# Patient Record
Sex: Female | Born: 1970
Health system: Southern US, Community
[De-identification: ages and names within clinical notes are randomized; demographics above are authoritative.]

## PROBLEM LIST (undated history)

## (undated) DIAGNOSIS — Z8619 Personal history of other infectious and parasitic diseases: Secondary | ICD-10-CM

## (undated) DIAGNOSIS — R87619 Unspecified abnormal cytological findings in specimens from cervix uteri: Secondary | ICD-10-CM

## (undated) DIAGNOSIS — IMO0002 Reserved for concepts with insufficient information to code with codable children: Secondary | ICD-10-CM

## (undated) DIAGNOSIS — L309 Dermatitis, unspecified: Secondary | ICD-10-CM

## (undated) DIAGNOSIS — B977 Papillomavirus as the cause of diseases classified elsewhere: Secondary | ICD-10-CM

## (undated) DIAGNOSIS — K219 Gastro-esophageal reflux disease without esophagitis: Secondary | ICD-10-CM

## (undated) DIAGNOSIS — L219 Seborrheic dermatitis, unspecified: Secondary | ICD-10-CM

## (undated) DIAGNOSIS — R87629 Unspecified abnormal cytological findings in specimens from vagina: Secondary | ICD-10-CM

## (undated) DIAGNOSIS — N943 Premenstrual tension syndrome: Secondary | ICD-10-CM

## (undated) HISTORY — DX: Gastro-esophageal reflux disease without esophagitis: K21.9

## (undated) HISTORY — DX: Personal history of other infectious and parasitic diseases: Z86.19

## (undated) HISTORY — DX: Unspecified abnormal cytological findings in specimens from cervix uteri: R87.619

## (undated) HISTORY — DX: Seborrheic dermatitis, unspecified: L21.9

## (undated) HISTORY — DX: Reserved for concepts with insufficient information to code with codable children: IMO0002

## (undated) HISTORY — DX: Papillomavirus as the cause of diseases classified elsewhere: B97.7

## (undated) HISTORY — PX: DILATION AND CURETTAGE OF UTERUS: SHX78

## (undated) HISTORY — PX: WISDOM TOOTH EXTRACTION: SHX21

## (undated) HISTORY — DX: Unspecified abnormal cytological findings in specimens from vagina: R87.629

## (undated) HISTORY — DX: Dermatitis, unspecified: L30.9

## (undated) HISTORY — DX: Premenstrual tension syndrome: N94.3

## (undated) HISTORY — PX: TUBAL LIGATION: SHX77

---

## 2001-04-03 ENCOUNTER — Other Ambulatory Visit: Admission: RE | Admit: 2001-04-03 | Discharge: 2001-04-03 | Payer: Self-pay | Admitting: Obstetrics and Gynecology

## 2008-11-17 ENCOUNTER — Other Ambulatory Visit: Admission: RE | Admit: 2008-11-17 | Discharge: 2008-11-17 | Payer: Self-pay | Admitting: Obstetrics and Gynecology

## 2010-02-01 ENCOUNTER — Other Ambulatory Visit: Admission: RE | Admit: 2010-02-01 | Discharge: 2010-02-01 | Payer: Self-pay | Admitting: Obstetrics and Gynecology

## 2010-03-17 ENCOUNTER — Encounter: Admission: RE | Admit: 2010-03-17 | Discharge: 2010-03-17 | Payer: Self-pay | Admitting: Obstetrics and Gynecology

## 2011-02-15 ENCOUNTER — Other Ambulatory Visit: Payer: Self-pay | Admitting: Obstetrics and Gynecology

## 2011-02-15 DIAGNOSIS — Z1231 Encounter for screening mammogram for malignant neoplasm of breast: Secondary | ICD-10-CM

## 2011-02-17 ENCOUNTER — Other Ambulatory Visit (HOSPITAL_COMMUNITY)
Admission: RE | Admit: 2011-02-17 | Discharge: 2011-02-17 | Disposition: A | Discharge status: Home / Self Care | Payer: BC Managed Care – PPO | Source: Ambulatory Visit | Attending: Obstetrics and Gynecology | Admitting: Obstetrics and Gynecology

## 2011-02-17 DIAGNOSIS — Z01419 Encounter for gynecological examination (general) (routine) without abnormal findings: Secondary | ICD-10-CM | POA: Insufficient documentation

## 2011-03-21 ENCOUNTER — Ambulatory Visit
Admission: RE | Admit: 2011-03-21 | Discharge: 2011-03-21 | Disposition: A | Discharge status: Home / Self Care | Payer: BC Managed Care – PPO | Source: Ambulatory Visit | Attending: Obstetrics and Gynecology | Admitting: Obstetrics and Gynecology

## 2011-03-21 DIAGNOSIS — Z1231 Encounter for screening mammogram for malignant neoplasm of breast: Secondary | ICD-10-CM

## 2012-02-22 ENCOUNTER — Other Ambulatory Visit: Payer: Self-pay | Admitting: Obstetrics and Gynecology

## 2012-02-22 DIAGNOSIS — Z1231 Encounter for screening mammogram for malignant neoplasm of breast: Secondary | ICD-10-CM

## 2012-03-27 ENCOUNTER — Ambulatory Visit
Admission: RE | Admit: 2012-03-27 | Discharge: 2012-03-27 | Disposition: A | Discharge status: Home / Self Care | Payer: BC Managed Care – PPO | Source: Ambulatory Visit | Attending: Obstetrics and Gynecology | Admitting: Obstetrics and Gynecology

## 2012-03-27 DIAGNOSIS — Z1231 Encounter for screening mammogram for malignant neoplasm of breast: Secondary | ICD-10-CM

## 2012-06-13 ENCOUNTER — Other Ambulatory Visit (HOSPITAL_COMMUNITY)
Admission: RE | Admit: 2012-06-13 | Discharge: 2012-06-13 | Disposition: A | Discharge status: Home / Self Care | Payer: BC Managed Care – PPO | Source: Ambulatory Visit | Attending: Obstetrics and Gynecology | Admitting: Obstetrics and Gynecology

## 2012-06-13 DIAGNOSIS — Z01419 Encounter for gynecological examination (general) (routine) without abnormal findings: Secondary | ICD-10-CM | POA: Insufficient documentation

## 2012-06-13 DIAGNOSIS — Z1151 Encounter for screening for human papillomavirus (HPV): Secondary | ICD-10-CM | POA: Insufficient documentation

## 2013-01-28 ENCOUNTER — Encounter: Payer: Self-pay | Admitting: *Deleted

## 2013-01-28 DIAGNOSIS — L309 Dermatitis, unspecified: Secondary | ICD-10-CM | POA: Insufficient documentation

## 2013-01-28 DIAGNOSIS — N943 Premenstrual tension syndrome: Secondary | ICD-10-CM

## 2013-01-28 DIAGNOSIS — I2510 Atherosclerotic heart disease of native coronary artery without angina pectoris: Secondary | ICD-10-CM

## 2013-01-29 ENCOUNTER — Ambulatory Visit (INDEPENDENT_AMBULATORY_CARE_PROVIDER_SITE_OTHER): Payer: BC Managed Care – PPO | Admitting: Adult Health

## 2013-01-29 ENCOUNTER — Encounter: Payer: Self-pay | Admitting: Adult Health

## 2013-01-29 VITALS — BP 132/92 | Ht 63.0 in | Wt 161.0 lb

## 2013-01-29 DIAGNOSIS — L739 Follicular disorder, unspecified: Secondary | ICD-10-CM

## 2013-01-29 DIAGNOSIS — L738 Other specified follicular disorders: Secondary | ICD-10-CM

## 2013-01-29 DIAGNOSIS — L219 Seborrheic dermatitis, unspecified: Secondary | ICD-10-CM | POA: Insufficient documentation

## 2013-01-29 HISTORY — DX: Seborrheic dermatitis, unspecified: L21.9

## 2013-01-29 NOTE — Patient Instructions (Addendum)
Follow up prn  Sign up for my chart

## 2013-01-29 NOTE — Progress Notes (Signed)
Subjective:     Patient ID: Brenda Silva, female   DOB: [birth year], 42 y.o.   MRN: [id number]  HPI [name] is a 42 year old white female in with resolving rash on right breast, has been on antibiotic from Dr. Jorja Loa, has stopped medications. She was seen by Dr. Jorja Loa for seborrheic dermatitis and red scaly areas that come and go and leave scars, ?cutaneous lupus?.  Review of Systems Rash right breast, no itching and it is resolving now. Dry scaly lesions eyebrows and ears.   Reviewed past medical,surgical, social and family history. Reviewed medications and allergies.  Objective:   Physical Exam Blood pressure 132/92, height 5\' 3"  (1.6 m), weight 161 lb (73.029 kg), last menstrual period 01/25/2013.   Skin warm and dry. Face: dry scaly areas in eye brows and ears. No red lesions noted today. Breast: no dominant masses, retraction or nipple discharge, there are a few areas of what appears to be resolving folliculitis on the right breast. Assessment:      Resolving folliculitis, right breast   Seborrheic dermatitis  Plan:    Use medication prescribed by Dr. Jorja Loa and follow up with him for ? Biopsy of lesions to r/o cutaneous lupus  Return prn

## 2013-02-18 ENCOUNTER — Other Ambulatory Visit: Payer: Self-pay

## 2013-02-18 DIAGNOSIS — Z1231 Encounter for screening mammogram for malignant neoplasm of breast: Secondary | ICD-10-CM

## 2013-03-28 ENCOUNTER — Ambulatory Visit
Admission: RE | Admit: 2013-03-28 | Discharge: 2013-03-28 | Disposition: A | Discharge status: Home / Self Care | Payer: BC Managed Care – PPO | Source: Ambulatory Visit

## 2013-03-28 DIAGNOSIS — Z1231 Encounter for screening mammogram for malignant neoplasm of breast: Secondary | ICD-10-CM

## 2013-06-18 ENCOUNTER — Ambulatory Visit (INDEPENDENT_AMBULATORY_CARE_PROVIDER_SITE_OTHER): Payer: BC Managed Care – PPO | Admitting: Adult Health

## 2013-06-18 ENCOUNTER — Encounter: Payer: Self-pay | Admitting: Adult Health

## 2013-06-18 VITALS — BP 120/60 | HR 74 | Ht 63.0 in | Wt 160.0 lb

## 2013-06-18 DIAGNOSIS — Z1212 Encounter for screening for malignant neoplasm of rectum: Secondary | ICD-10-CM

## 2013-06-18 DIAGNOSIS — K219 Gastro-esophageal reflux disease without esophagitis: Secondary | ICD-10-CM | POA: Insufficient documentation

## 2013-06-18 DIAGNOSIS — Z01419 Encounter for gynecological examination (general) (routine) without abnormal findings: Secondary | ICD-10-CM

## 2013-06-18 LAB — HEMOCCULT GUIAC POC 1CARD (OFFICE): Fecal Occult Blood, POC: NEGATIVE

## 2013-06-18 MED ORDER — DEXLANSOPRAZOLE 60 MG PO CPDR: 60.0000 mg | DELAYED_RELEASE_CAPSULE | Freq: Every day | ORAL | Status: DC

## 2013-06-18 NOTE — Patient Instructions (Addendum)
Physical in 1 year Mammogram yearly Colonoscopy at 48  Think about flu shot

## 2013-06-18 NOTE — Progress Notes (Signed)
Patient ID: Brenda Silva, female   DOB: 12-16-1970, 42 y.o.   MRN: [id number] History of Present Illness:  Brenda Silva is a 42 year old white female married in for a physical. She had a normal pap with negative HPV 06/2012.  Current Medications, Allergies, Past Medical History, Past Surgical History, Family History and Social History were reviewed in [employer] record.     Review of Systems: Patient denies any headaches, blurred vision, shortness of breath, chest pain, abdominal pain, problems with bowel movements, urination, or intercourse. No joint pain or mood swings, she is happy with dexilant feels great.    Physical Exam:BP 120/60  Pulse 74  Ht 5\' 3"  (1.6 m)  Wt 160 lb (72.576 kg)  BMI 28.35 kg/m2  LMP 06/08/2013 General:  Well developed, well nourished, no acute distress Skin:  Warm and dry Neck:  Midline trachea, normal thyroid Lungs; Clear to auscultation bilaterally Breast:  No dominant palpable mass, retraction, or nipple discharge Cardiovascular: Regular rate and rhythm Abdomen:  Soft, non tender, no hepatosplenomegaly Pelvic:  External genitalia is normal in appearance.  The vagina is normal in appearance. The cervix is bulbous.  Uterus is felt to be normal size, shape, and contour.  No adnexal masses or tenderness noted. Rectal: Good sphincter tone, no polyps, or hemorrhoids felt.  Hemoccult negative. Extremities:  No swelling or varicosities noted Psych:  Alert and cooperative seems happy   Impression:  Yearly gyn exam-no pap GERD   Plan: Physical in 1 year  Mammogram yearly Colonoscopy at 48  Get flu shot Refilled dexilant x 1 year

## 2014-02-18 ENCOUNTER — Other Ambulatory Visit: Payer: Self-pay

## 2014-02-18 DIAGNOSIS — Z1231 Encounter for screening mammogram for malignant neoplasm of breast: Secondary | ICD-10-CM

## 2014-03-31 ENCOUNTER — Ambulatory Visit
Admission: RE | Admit: 2014-03-31 | Discharge: 2014-03-31 | Disposition: A | Discharge status: Home / Self Care | Payer: BC Managed Care – PPO | Source: Ambulatory Visit

## 2014-03-31 DIAGNOSIS — Z1231 Encounter for screening mammogram for malignant neoplasm of breast: Secondary | ICD-10-CM

## 2014-06-23 ENCOUNTER — Encounter: Payer: Self-pay | Admitting: Adult Health

## 2014-06-23 ENCOUNTER — Ambulatory Visit (INDEPENDENT_AMBULATORY_CARE_PROVIDER_SITE_OTHER): Payer: BC Managed Care – PPO | Admitting: Adult Health

## 2014-06-23 ENCOUNTER — Other Ambulatory Visit (HOSPITAL_COMMUNITY)
Admission: RE | Admit: 2014-06-23 | Discharge: 2014-06-23 | Disposition: A | Discharge status: Home / Self Care | Payer: BC Managed Care – PPO | Source: Ambulatory Visit | Attending: Adult Health | Admitting: Adult Health

## 2014-06-23 VITALS — BP 112/80 | HR 76 | Ht 63.0 in | Wt 166.0 lb

## 2014-06-23 DIAGNOSIS — K219 Gastro-esophageal reflux disease without esophagitis: Secondary | ICD-10-CM

## 2014-06-23 DIAGNOSIS — Z1151 Encounter for screening for human papillomavirus (HPV): Secondary | ICD-10-CM | POA: Insufficient documentation

## 2014-06-23 DIAGNOSIS — Z1212 Encounter for screening for malignant neoplasm of rectum: Secondary | ICD-10-CM

## 2014-06-23 DIAGNOSIS — Z01419 Encounter for gynecological examination (general) (routine) without abnormal findings: Secondary | ICD-10-CM

## 2014-06-23 LAB — HEMOCCULT GUIAC POC 1CARD (OFFICE): Fecal Occult Blood, POC: NEGATIVE

## 2014-06-23 MED ORDER — DEXLANSOPRAZOLE 60 MG PO CPDR: 60.0000 mg | DELAYED_RELEASE_CAPSULE | Freq: Every day | ORAL | Status: DC

## 2014-06-23 NOTE — Progress Notes (Signed)
Patient ID: Brenda Silva, female   DOB: 1970/10/09, 43 y.o.   MRN: [id number] History of Present Illness: Brenda Silva is a 43 year old white female, married, in for a pap and physical.Periods ate lite and short and having hot flashes at night.   Current Medications, Allergies, Past Medical History, Past Surgical History, Family History and Social History were reviewed in [employer] record.     Review of Systems: Patient denies any headaches, blurred vision, shortness of breath, chest pain, abdominal pain, problems with bowel movements, urination, or intercourse.  No joint pain or mood swings.   Physical Exam:BP 112/80  Pulse 76  Ht 5\' 3"  (1.6 m)  Wt 166 lb (75.297 kg)  BMI 29.41 kg/m2  LMP 06/06/2014 General:  Well developed, well nourished, no acute distress Skin:  Warm and dry Neck:  Midline trachea, normal thyroid Lungs; Clear to auscultation bilaterally Breast:  No dominant palpable mass, retraction, or nipple discharge Cardiovascular: Regular rate and rhythm Abdomen:  Soft, non tender, no hepatosplenomegaly Pelvic:  External genitalia is normal in appearance.  The vagina is normal in appearance. The cervix is bulbous, Pap with HPV preformed.Marland Kitchen  Uterus is felt to be normal size, shape, and contour.  No                adnexal masses or tenderness noted. Rectal: Good sphincter tone, no polyps, or hemorrhoids felt.  Hemoccult negative. Extremities:  No swelling or varicosities noted Psych:  No mood changes, alert and cooperative, seems happy    Impression: Yearly gyn exam GERD    Plan:  Refilled dexilant 60 mg #30 1 daily with 11 refills Return in 1 year for physical  Mammogram yearly Check CBC,TSH,CMP and lipids Advised flu shot

## 2014-06-23 NOTE — Patient Instructions (Addendum)
Perimenopause Perimenopause is the time when your body begins to move into the menopause (no menstrual period for 12 straight months). It is a natural process. Perimenopause can begin 2-8 years before the menopause and usually lasts for 1 year after the menopause. During this time, your ovaries may or may not produce an egg. The ovaries vary in their production of estrogen and progesterone hormones each month. This can cause irregular menstrual periods, difficulty getting pregnant, vaginal bleeding between periods, and uncomfortable symptoms. CAUSES  Irregular production of the ovarian hormones, estrogen and progesterone, and not ovulating every month.  Other causes include:  Tumor of the pituitary gland in the brain.  Medical disease that affects the ovaries.  Radiation treatment.  Chemotherapy.  Unknown causes.  Heavy smoking and excessive alcohol intake can bring on perimenopause sooner. SIGNS AND SYMPTOMS   Hot flashes.  Night sweats.  Irregular menstrual periods.  Decreased sex drive.  Vaginal dryness.  Headaches.  Mood swings.  Depression.  Memory problems.  Irritability.  Tiredness.  Weight gain.  Trouble getting pregnant.  The beginning of losing bone cells (osteoporosis).  The beginning of hardening of the arteries (atherosclerosis). DIAGNOSIS  Your health care provider will make a diagnosis by analyzing your age, menstrual history, and symptoms. He or she will do a physical exam and note any changes in your body, especially your female organs. Female hormone tests may or may not be helpful depending on the amount of female hormones you produce and when you produce them. However, other hormone tests may be helpful to rule out other problems. TREATMENT  In some cases, no treatment is needed. The decision on whether treatment is necessary during the perimenopause should be made by you and your health care provider based on how the symptoms are affecting you  and your lifestyle. Various treatments are available, such as:  Treating individual symptoms with a specific medicine for that symptom.  Herbal medicines that can help specific symptoms.  Counseling.  Group therapy. HOME CARE INSTRUCTIONS   Keep track of your menstrual periods (when they occur, how heavy they are, how long between periods, and how long they last) as well as your symptoms and when they started.  Only take over-the-counter or prescription medicines as directed by your health care provider.  Sleep and rest.  Exercise.  Eat a diet that contains calcium (good for your bones) and soy (acts like the estrogen hormone).  Do not smoke.  Avoid alcoholic beverages.  Take vitamin supplements as recommended by your health care provider. Taking vitamin E may help in certain cases.  Take calcium and vitamin D supplements to help prevent bone loss.  Group therapy is sometimes helpful.  Acupuncture may help in some cases. SEEK MEDICAL CARE IF:   You have questions about any symptoms you are having.  You need a referral to a specialist (gynecologist, psychiatrist, or psychologist). SEEK IMMEDIATE MEDICAL CARE IF:   You have vaginal bleeding.  Your period lasts longer than 8 days.  Your periods are recurring sooner than 21 days.  You have bleeding after intercourse.  You have severe depression.  You have pain when you urinate.  You have severe headaches.  You have vision problems. Document Released: 10/27/2004 Document Revised: 07/10/2013 Document Reviewed: 04/18/2013 [facility] Patient Information 2015 [place], [place]. This information is not intended to replace advice given to you by your health care provider. Make sure you discuss any questions you have with your health care provider. Mammogram yearly  Physical in  1year

## 2014-06-24 ENCOUNTER — Telehealth: Payer: Self-pay | Admitting: Adult Health

## 2014-06-24 LAB — CBC
HCT: 36.4 % (ref 36.0–46.0)
Hemoglobin: 11.9 g/dL — ABNORMAL LOW (ref 12.0–15.0)
MCH: 26.2 pg (ref 26.0–34.0)
MCHC: 32.7 g/dL (ref 30.0–36.0)
MCV: 80.2 fL (ref 78.0–100.0)
Platelets: 380 10*3/uL (ref 150–400)
RBC: 4.54 MIL/uL (ref 3.87–5.11)
RDW: 14.6 % (ref 11.5–15.5)
WBC: 6.8 10*3/uL (ref 4.0–10.5)

## 2014-06-24 LAB — COMPREHENSIVE METABOLIC PANEL
ALT: 27 U/L (ref 0–35)
AST: 23 U/L (ref 0–37)
Albumin: 4.3 g/dL (ref 3.5–5.2)
Alkaline Phosphatase: 108 U/L (ref 39–117)
BUN: 16 mg/dL (ref 6–23)
CO2: 28 mEq/L (ref 19–32)
Calcium: 9.8 mg/dL (ref 8.4–10.5)
Chloride: 103 mEq/L (ref 96–112)
Creat: 0.95 mg/dL (ref 0.50–1.10)
Glucose, Bld: 83 mg/dL (ref 70–99)
Potassium: 4.9 mEq/L (ref 3.5–5.3)
Sodium: 139 mEq/L (ref 135–145)
Total Bilirubin: 0.4 mg/dL (ref 0.2–1.2)
Total Protein: 7.1 g/dL (ref 6.0–8.3)

## 2014-06-24 LAB — LIPID PANEL
Cholesterol: 167 mg/dL (ref 0–200)
HDL: 46 mg/dL (ref >39)
LDL Cholesterol: 104 mg/dL — ABNORMAL HIGH (ref 0–99)
Total CHOL/HDL Ratio: 3.6 Ratio
Triglycerides: 85 mg/dL (ref <150)
VLDL: 17 mg/dL (ref 0–40)

## 2014-06-24 LAB — TSH: TSH: 3.043 u[IU]/mL (ref 0.350–4.500)

## 2014-06-24 NOTE — Telephone Encounter (Signed)
Left message labs look good 

## 2014-06-25 LAB — CYTOLOGY - PAP

## 2014-08-04 ENCOUNTER — Encounter: Payer: Self-pay | Admitting: Adult Health

## 2014-10-30 ENCOUNTER — Telehealth: Payer: Self-pay | Admitting: Adult Health

## 2014-10-30 NOTE — Telephone Encounter (Signed)
Called to talk, her step Mom died recently

## 2015-02-23 ENCOUNTER — Other Ambulatory Visit: Payer: Self-pay

## 2015-02-23 DIAGNOSIS — Z1231 Encounter for screening mammogram for malignant neoplasm of breast: Secondary | ICD-10-CM

## 2015-04-03 ENCOUNTER — Ambulatory Visit
Admission: RE | Admit: 2015-04-03 | Discharge: 2015-04-03 | Disposition: A | Discharge status: Home / Self Care | Payer: BLUE CROSS/BLUE SHIELD | Source: Ambulatory Visit

## 2015-04-03 DIAGNOSIS — Z1231 Encounter for screening mammogram for malignant neoplasm of breast: Secondary | ICD-10-CM

## 2015-08-04 ENCOUNTER — Other Ambulatory Visit: Payer: Self-pay | Admitting: Adult Health

## 2015-08-12 ENCOUNTER — Other Ambulatory Visit: Payer: Self-pay | Admitting: Adult Health

## 2015-08-13 ENCOUNTER — Ambulatory Visit (INDEPENDENT_AMBULATORY_CARE_PROVIDER_SITE_OTHER): Payer: BLUE CROSS/BLUE SHIELD | Admitting: Adult Health

## 2015-08-13 ENCOUNTER — Encounter: Payer: Self-pay | Admitting: Adult Health

## 2015-08-13 VITALS — BP 140/86 | HR 72 | Ht 63.25 in | Wt 162.0 lb

## 2015-08-13 DIAGNOSIS — Z01419 Encounter for gynecological examination (general) (routine) without abnormal findings: Secondary | ICD-10-CM | POA: Diagnosis not present

## 2015-08-13 DIAGNOSIS — Z1211 Encounter for screening for malignant neoplasm of colon: Secondary | ICD-10-CM

## 2015-08-13 DIAGNOSIS — K219 Gastro-esophageal reflux disease without esophagitis: Secondary | ICD-10-CM

## 2015-08-13 LAB — HEMOCCULT GUIAC POC 1CARD (OFFICE): Fecal Occult Blood, POC: NEGATIVE

## 2015-08-13 NOTE — Progress Notes (Signed)
Patient ID: Brenda Silva, female   DOB: April 22, 1971, 44 y.o.   MRN: [id number] History of Present Illness: Brenda Silva is a 44 year old white female,married in for a well woman gyn exam,she had a normal pap with negative HPV 06/23/14.   Current Medications, Allergies, Past Medical History, Past Surgical History, Family History and Social History were reviewed in [employer] record.     Review of Systems: Patient denies any headaches, hearing loss, fatigue, blurred vision, shortness of breath, chest pain, abdominal pain, problems with bowel movements, urination, or intercourse. No joint pain or mood swings.She still takes dexilant for GERD and is doing well.    Physical Exam:BP 140/86 mmHg  Pulse 72  Ht 5' 3.25" (1.607 m)  Wt 162 lb (73.483 kg)  BMI 28.45 kg/m2 General:  Well developed, well nourished, no acute distress Skin:  Warm and dry Neck:  Midline trachea, normal thyroid, good ROM, no lymphadenopathy Lungs; Clear to auscultation bilaterally Breast:  No dominant palpable mass, retraction, or nipple discharge Cardiovascular: Regular rate and rhythm Abdomen:  Soft, non tender, no hepatosplenomegaly Pelvic:  External genitalia is normal in appearance, no lesions.  The vagina is normal in appearance. Urethra has no lesions or masses. The cervix is bulbous.  Uterus is felt to be normal size, shape, and contour.  No adnexal masses or tenderness noted.Bladder is non tender, no masses felt. Rectal: Good sphincter tone, no polyps, or hemorrhoids felt.  Hemoccult negative. Extremities/musculoskeletal:  No swelling or varicosities noted, no clubbing or cyanosis Psych:  No mood changes, alert and cooperative,seems happy   Impression: Well woman gyn exam no pap GERD    Plan: Physical in 1 year Mammogram yearly Labs next year Has refills on dexilant,continue 1 daily

## 2015-08-13 NOTE — Patient Instructions (Signed)
Physical in 1 year Mammogram yearly  

## 2016-03-01 ENCOUNTER — Other Ambulatory Visit: Payer: Self-pay

## 2016-03-01 DIAGNOSIS — Z1231 Encounter for screening mammogram for malignant neoplasm of breast: Secondary | ICD-10-CM

## 2016-04-04 ENCOUNTER — Ambulatory Visit: Payer: BLUE CROSS/BLUE SHIELD

## 2016-04-06 ENCOUNTER — Other Ambulatory Visit: Payer: Self-pay

## 2016-04-06 ENCOUNTER — Ambulatory Visit
Admission: RE | Admit: 2016-04-06 | Discharge: 2016-04-06 | Disposition: A | Discharge status: Home / Self Care | Payer: BLUE CROSS/BLUE SHIELD | Source: Ambulatory Visit

## 2016-04-06 DIAGNOSIS — Z1231 Encounter for screening mammogram for malignant neoplasm of breast: Secondary | ICD-10-CM

## 2016-07-15 DIAGNOSIS — L821 Other seborrheic keratosis: Secondary | ICD-10-CM | POA: Diagnosis not present

## 2016-07-15 DIAGNOSIS — L309 Dermatitis, unspecified: Secondary | ICD-10-CM | POA: Diagnosis not present

## 2016-08-16 ENCOUNTER — Other Ambulatory Visit: Payer: BLUE CROSS/BLUE SHIELD | Admitting: Adult Health

## 2016-09-06 ENCOUNTER — Ambulatory Visit (INDEPENDENT_AMBULATORY_CARE_PROVIDER_SITE_OTHER): Payer: BLUE CROSS/BLUE SHIELD | Admitting: Adult Health

## 2016-09-06 ENCOUNTER — Other Ambulatory Visit (HOSPITAL_COMMUNITY)
Admission: RE | Admit: 2016-09-06 | Discharge: 2016-09-06 | Disposition: A | Discharge status: Home / Self Care | Payer: BLUE CROSS/BLUE SHIELD | Source: Ambulatory Visit | Attending: Adult Health | Admitting: Adult Health

## 2016-09-06 ENCOUNTER — Encounter: Payer: Self-pay | Admitting: Adult Health

## 2016-09-06 VITALS — BP 122/76 | HR 70 | Ht 63.25 in | Wt 161.5 lb

## 2016-09-06 DIAGNOSIS — Z01419 Encounter for gynecological examination (general) (routine) without abnormal findings: Secondary | ICD-10-CM

## 2016-09-06 DIAGNOSIS — Z1211 Encounter for screening for malignant neoplasm of colon: Secondary | ICD-10-CM

## 2016-09-06 DIAGNOSIS — Z1151 Encounter for screening for human papillomavirus (HPV): Secondary | ICD-10-CM | POA: Diagnosis not present

## 2016-09-06 DIAGNOSIS — N926 Irregular menstruation, unspecified: Secondary | ICD-10-CM | POA: Insufficient documentation

## 2016-09-06 DIAGNOSIS — Z01411 Encounter for gynecological examination (general) (routine) with abnormal findings: Secondary | ICD-10-CM

## 2016-09-06 DIAGNOSIS — N951 Menopausal and female climacteric states: Secondary | ICD-10-CM | POA: Insufficient documentation

## 2016-09-06 LAB — HEMOCCULT GUIAC POC 1CARD (OFFICE): Fecal Occult Blood, POC: NEGATIVE

## 2016-09-06 NOTE — Patient Instructions (Addendum)
Menopause Menopause is the normal time of life when menstrual periods stop completely. Menopause is complete when you have missed 12 consecutive menstrual periods. It usually occurs between the ages of 63 years and 78 years. Very rarely does a woman develop menopause before the age of 74 years. At menopause, your ovaries stop producing the female hormones estrogen and progesterone. This can cause undesirable symptoms and also affect your health. Sometimes the symptoms may occur 4-5 years before the menopause begins. There is no relationship between menopause and:  Oral contraceptives.  Number of children you had.  Race.  The age your menstrual periods started (menarche). Heavy smokers and very thin women may develop menopause earlier in life. What are the causes?  The ovaries stop producing the female hormones estrogen and progesterone. Other causes include:  Surgery to remove both ovaries.  The ovaries stop functioning for no known reason.  Tumors of the pituitary gland in the brain.  Medical disease that affects the ovaries and hormone production.  Radiation treatment to the abdomen or pelvis.  Chemotherapy that affects the ovaries. What are the signs or symptoms?  Hot flashes.  Night sweats.  Decrease in sex drive.  Vaginal dryness and thinning of the vagina causing painful intercourse.  Dryness of the skin and developing wrinkles.  Headaches.  Tiredness.  Irritability.  Memory problems.  Weight gain.  Bladder infections.  Hair growth of the face and chest.  Infertility. More serious symptoms include:  Loss of bone (osteoporosis) causing breaks (fractures).  Depression.  Hardening and narrowing of the arteries (atherosclerosis) causing heart attacks and strokes. How is this diagnosed?  When the menstrual periods have stopped for 12 straight months.  Physical exam.  Hormone studies of the blood. How is this treated? There are many treatment  choices and nearly as many questions about them. The decisions to treat or not to treat menopausal changes is an individual choice made with your health care provider. Your health care provider can discuss the treatments with you. Together, you can decide which treatment will work best for you. Your treatment choices may include:  Hormone therapy (estrogen and progesterone).  Non-hormonal medicines.  Treating the individual symptoms with medicine (for example antidepressants for depression).  Herbal medicines that may help specific symptoms.  Counseling by a psychiatrist or psychologist.  Group therapy.  Lifestyle changes including:  Eating healthy.  Regular exercise.  Limiting caffeine and alcohol.  Stress management and meditation.  No treatment. Follow these instructions at home:  Take the medicine your health care provider gives you as directed.  Get plenty of sleep and rest.  Exercise regularly.  Eat a diet that contains calcium (good for the bones) and soy products (acts like estrogen hormone).  Avoid alcoholic beverages.  Do not smoke.  If you have hot flashes, dress in layers.  Take supplements, calcium, and vitamin D to strengthen bones.  You can use over-the-counter lubricants or moisturizers for vaginal dryness.  Group therapy is sometimes very helpful.  Acupuncture may be helpful in some cases. Contact a health care provider if:  You are not sure you are in menopause.  You are having menopausal symptoms and need advice and treatment.  You are still having menstrual periods after age 32 years.  You have pain with intercourse.  Menopause is complete (no menstrual period for 12 months) and you develop vaginal bleeding.  You need a referral to a specialist (gynecologist, psychiatrist, or psychologist) for treatment. Get help right away if:  You  have severe depression.  You have excessive vaginal bleeding.  You fell and think you have a broken  bone.  You have pain when you urinate.  You develop leg or chest pain.  You have a fast pounding heart beat (palpitations).  You have severe headaches.  You develop vision problems.  You feel a lump in your breast.  You have abdominal pain or severe indigestion. This information is not intended to replace advice given to you by your health care provider. Make sure you discuss any questions you have with your health care provider. Document Released: 12/10/2003 Document Revised: 02/25/2016 Document Reviewed: 04/18/2013 Elsevier Interactive Patient Education  2017 [employer]. Physical in 1 year Mammogram yearly

## 2016-09-06 NOTE — Progress Notes (Signed)
Patient ID: Brenda Silva, female   DOB: 11-26-1970, 45 y.o.   MRN: [id number] History of Present Illness:  Brenda Silva is a 45 year old white female, married in for well woman gyn exam and pap, and requests labs.   Current Medications, Allergies, Past Medical History, Past Surgical History, Family History and Social History were reviewed in [employer] record.     Review of Systems: Patient denies any headaches, hearing loss, fatigue, blurred vision, shortness of breath, chest pain, abdominal pain, problems with bowel movements, urination, or intercourse. No joint pain or mood swings.Periods irregular,may skip then may have one every 14 days, no hot flashes, but decrease in sex drive.    Physical Exam:BP 122/76 (BP Location: Left Arm, Patient Position: Sitting, Cuff Size: Normal)   Pulse 70   Ht 5' 3.25" (1.607 m)   Wt 161 lb 8 oz (73.3 kg)   LMP 08/29/2016 (Exact Date)   BMI 28.38 kg/m  General:  Well developed, well nourished, no acute distress Skin:  Warm and dry Neck:  Midline trachea, normal thyroid, good ROM, no lymphadenopathy Lungs; Clear to auscultation bilaterally Breast:  No dominant palpable mass, retraction, or nipple discharge Cardiovascular: Regular rate and rhythm Abdomen:  Soft, non tender, no hepatosplenomegaly Pelvic:  External genitalia is normal in appearance, no lesions.  The vagina is normal in appearance. Urethra has no lesions or masses. The cervix is bulbous.Pap with HPV.  Uterus is felt to be normal size, shape, and contour.  No adnexal masses or tenderness noted.Bladder is non tender, no masses felt. Rectal: Good sphincter tone, no polyps, or hemorrhoids felt.  Hemoccult negative. Extremities/musculoskeletal:  No swelling or varicosities noted, no clubbing or cyanosis Psych:  No mood changes, alert and cooperative,seems happy PHQ 2 0.Shedeclines hormone manipulation of periods.  Impression: 1. Encounter for gynecological examination with  Papanicolaou smear of cervix   2. Irregular periods   3. Peri-menopause       Plan: Check CBC,CMP,TSH and lipids,A1c and vitamin D and [place] [employer] handout on menopause Physical in 1 year, pap in 3 if normal Mammogram yearly Colonoscopy at 77

## 2016-09-07 ENCOUNTER — Telehealth: Payer: Self-pay | Admitting: Adult Health

## 2016-09-07 LAB — FOLLICLE STIMULATING HORMONE: FSH: 8.7 m[IU]/mL

## 2016-09-07 LAB — CBC
Hematocrit: 37.6 % (ref 34.0–46.6)
Hemoglobin: 12.7 g/dL (ref 11.1–15.9)
MCH: 27.9 pg (ref 26.6–33.0)
MCHC: 33.8 g/dL (ref 31.5–35.7)
MCV: 83 fL (ref 79–97)
Platelets: 427 10*3/uL — ABNORMAL HIGH (ref 150–379)
RBC: 4.55 x10E6/uL (ref 3.77–5.28)
RDW: 14.4 % (ref 12.3–15.4)
WBC: 8.3 10*3/uL (ref 3.4–10.8)

## 2016-09-07 LAB — HEMOGLOBIN A1C
Est. average glucose Bld gHb Est-mCnc: 100 mg/dL
Hgb A1c MFr Bld: 5.1 % (ref 4.8–5.6)

## 2016-09-07 LAB — COMPREHENSIVE METABOLIC PANEL
ALT: 14 IU/L (ref 0–32)
AST: 17 IU/L (ref 0–40)
Albumin/Globulin Ratio: 1.5 (ref 1.2–2.2)
Albumin: 4.3 g/dL (ref 3.5–5.5)
Alkaline Phosphatase: 90 IU/L (ref 39–117)
BUN/Creatinine Ratio: 17 (ref 9–23)
BUN: 14 mg/dL (ref 6–24)
Bilirubin Total: 0.3 mg/dL (ref 0.0–1.2)
CO2: 26 mmol/L (ref 18–29)
Calcium: 9.5 mg/dL (ref 8.7–10.2)
Chloride: 103 mmol/L (ref 96–106)
Creatinine, Ser: 0.82 mg/dL (ref 0.57–1.00)
GFR calc Af Amer: 99 mL/min/{1.73_m2} (ref >59)
GFR calc non Af Amer: 85 mL/min/{1.73_m2} (ref >59)
Globulin, Total: 2.9 g/dL (ref 1.5–4.5)
Glucose: 87 mg/dL (ref 65–99)
Potassium: 4.9 mmol/L (ref 3.5–5.2)
Sodium: 143 mmol/L (ref 134–144)
Total Protein: 7.2 g/dL (ref 6.0–8.5)

## 2016-09-07 LAB — VITAMIN D 25 HYDROXY (VIT D DEFICIENCY, FRACTURES): Vit D, 25-Hydroxy: 30 ng/mL (ref 30.0–100.0)

## 2016-09-07 LAB — LIPID PANEL
Chol/HDL Ratio: 4 ratio units (ref 0.0–4.4)
Cholesterol, Total: 173 mg/dL (ref 100–199)
HDL: 43 mg/dL (ref >39)
LDL Calculated: 113 mg/dL — ABNORMAL HIGH (ref 0–99)
Triglycerides: 83 mg/dL (ref 0–149)
VLDL Cholesterol Cal: 17 mg/dL (ref 5–40)

## 2016-09-07 LAB — CYTOLOGY - PAP
Diagnosis: NEGATIVE
HPV: NOT DETECTED

## 2016-09-07 LAB — TSH: TSH: 2.19 u[IU]/mL (ref 0.450–4.500)

## 2016-09-07 NOTE — Telephone Encounter (Signed)
Pt aware of labs, and they are good

## 2017-02-23 ENCOUNTER — Other Ambulatory Visit: Payer: Self-pay | Admitting: Obstetrics and Gynecology

## 2017-02-23 DIAGNOSIS — Z1231 Encounter for screening mammogram for malignant neoplasm of breast: Secondary | ICD-10-CM

## 2017-04-10 ENCOUNTER — Ambulatory Visit
Admission: RE | Admit: 2017-04-10 | Discharge: 2017-04-10 | Disposition: A | Discharge status: Home / Self Care | Payer: BLUE CROSS/BLUE SHIELD | Source: Ambulatory Visit | Attending: Obstetrics and Gynecology | Admitting: Obstetrics and Gynecology

## 2017-04-10 DIAGNOSIS — Z1231 Encounter for screening mammogram for malignant neoplasm of breast: Secondary | ICD-10-CM

## 2017-04-27 DIAGNOSIS — M7742 Metatarsalgia, left foot: Secondary | ICD-10-CM | POA: Diagnosis not present

## 2017-11-01 ENCOUNTER — Telehealth: Payer: Self-pay | Admitting: *Deleted

## 2017-11-01 NOTE — Telephone Encounter (Signed)
No period in 7 months, hot at night, it is menopause, to make physical appt

## 2018-01-16 ENCOUNTER — Encounter: Payer: Self-pay | Admitting: Adult Health

## 2018-01-16 ENCOUNTER — Ambulatory Visit (INDEPENDENT_AMBULATORY_CARE_PROVIDER_SITE_OTHER): Payer: BLUE CROSS/BLUE SHIELD | Admitting: Adult Health

## 2018-01-16 VITALS — BP 110/80 | HR 98 | Ht 63.0 in | Wt 164.0 lb

## 2018-01-16 DIAGNOSIS — Z1212 Encounter for screening for malignant neoplasm of rectum: Secondary | ICD-10-CM | POA: Diagnosis not present

## 2018-01-16 DIAGNOSIS — R5383 Other fatigue: Secondary | ICD-10-CM

## 2018-01-16 DIAGNOSIS — F489 Nonpsychotic mental disorder, unspecified: Secondary | ICD-10-CM

## 2018-01-16 DIAGNOSIS — Z131 Encounter for screening for diabetes mellitus: Secondary | ICD-10-CM | POA: Diagnosis not present

## 2018-01-16 DIAGNOSIS — R232 Flushing: Secondary | ICD-10-CM | POA: Diagnosis not present

## 2018-01-16 DIAGNOSIS — R6882 Decreased libido: Secondary | ICD-10-CM

## 2018-01-16 DIAGNOSIS — Z13220 Encounter for screening for lipoid disorders: Secondary | ICD-10-CM | POA: Diagnosis not present

## 2018-01-16 DIAGNOSIS — Z01419 Encounter for gynecological examination (general) (routine) without abnormal findings: Secondary | ICD-10-CM | POA: Diagnosis not present

## 2018-01-16 DIAGNOSIS — Z1211 Encounter for screening for malignant neoplasm of colon: Secondary | ICD-10-CM | POA: Insufficient documentation

## 2018-01-16 DIAGNOSIS — Z01411 Encounter for gynecological examination (general) (routine) with abnormal findings: Secondary | ICD-10-CM

## 2018-01-16 DIAGNOSIS — Z79890 Hormone replacement therapy (postmenopausal): Secondary | ICD-10-CM

## 2018-01-16 DIAGNOSIS — R4589 Other symptoms and signs involving emotional state: Secondary | ICD-10-CM | POA: Insufficient documentation

## 2018-01-16 DIAGNOSIS — N951 Menopausal and female climacteric states: Secondary | ICD-10-CM | POA: Diagnosis not present

## 2018-01-16 DIAGNOSIS — Z1321 Encounter for screening for nutritional disorder: Secondary | ICD-10-CM | POA: Diagnosis not present

## 2018-01-16 DIAGNOSIS — Z1329 Encounter for screening for other suspected endocrine disorder: Secondary | ICD-10-CM | POA: Diagnosis not present

## 2018-01-16 LAB — HEMOCCULT GUIAC POC 1CARD (OFFICE): Fecal Occult Blood, POC: NEGATIVE

## 2018-01-16 MED ORDER — PROGESTERONE MICRONIZED 200 MG PO CAPS: 200.0000 mg | ORAL_CAPSULE | Freq: Every day | ORAL | 12 refills | Status: DC

## 2018-01-16 MED ORDER — ESTRADIOL 0.05 MG/24HR TD PTTW: 1.0000 | MEDICATED_PATCH | TRANSDERMAL | 12 refills | Status: DC

## 2018-01-16 NOTE — Patient Instructions (Signed)
Menopause and Hormone Replacement Therapy What is hormone replacement therapy? Hormone replacement therapy (HRT) is the use of artificial (synthetic) hormones to replace hormones that your body stops producing during menopause. Menopause is the normal time of life when menstrual periods stop completely and the ovaries stop producing the female hormones estrogen and progesterone. This lack of hormones can affect your health and cause undesirable symptoms. HRT can relieve some of those symptoms. What are my options for HRT? HRT may consist of the synthetic hormones estrogen and progestin, or it may consist of only estrogen (estrogen-only therapy). You and your health care provider will decide which form of HRT is best for you. If you choose to be on HRT and you have a uterus, estrogen and progestin are usually prescribed. Estrogen-only therapy is used for women who do not have a uterus. Possible options for taking HRT include:  Pills.  Patches.  Gels.  Sprays.  Vaginal cream.  Vaginal rings.  Vaginal inserts.  The amount of hormone(s) that you take and how long you take the hormone(s) varies depending on your individual health. It is important to:  Begin HRT with the lowest possible dosage.  Stop HRT as soon as your health care provider tells you to stop.  Work with your health care provider so that you feel informed and comfortable with your decisions.  What are the benefits of HRT? HRT can reduce the frequency and severity of menopausal symptoms. Benefits of HRT vary depending on the menopausal symptoms that you have, the severity of your symptoms, and your overall health. HRT may help to improve the following menopausal symptoms:  Hot flashes and night sweats. These are sudden feelings of heat that spread over the face and body. The skin may turn red, like a blush. Night sweats are hot flashes that happen while you are sleeping or trying to sleep.  Bone loss (osteoporosis). The  body loses calcium more quickly after menopause, causing the bones to become weaker. This can increase the risk for bone breaks (fractures).  Vaginal dryness. The lining of the vagina can become thin and dry, which can cause pain during sexual intercourse or cause infection, burning, or itching.  Urinary tract infections.  Urinary incontinence. This is a decreased ability to control when you urinate.  Irritability.  Short-term memory problems.  What are the risks of HRT? Risks of HRT vary depending on your individual health and medical history. Risks of HRT also depend on whether you receive both estrogen and progestin or you receive estrogen only.HRT may increase the risk of:  Spotting. This is when a small amount of bloodleaks from the vagina unexpectedly.  Endometrial cancer. This cancer is in the lining of the uterus (endometrium).  Breast cancer.  Increased density of breast tissue. This can make it harder to find breast cancer on a breast X-ray (mammogram).  Stroke.  Heart attack.  Blood clots.  Gallbladder disease.  Risks of HRT can increase if you have any of the following conditions:  Endometrial cancer.  Liver disease.  Heart disease.  Breast cancer.  History of blood clots.  History of stroke.  How should I care for myself while I am on HRT?  Take over-the-counter and prescription medicines only as told by your health care provider.  Get mammograms, pelvic exams, and medical checkups as often as told by your health care provider.  Have Pap tests done as often as told by your health care provider. A Pap test is sometimes called a Pap   smear. It is a screening test that is used to check for signs of cancer of the cervix and vagina. A Pap test can also identify the presence of infection or precancerous changes. Pap tests may be done: ? Every 3 years, starting at age 21. ? Every 5 years, starting after age 30, in combination with testing for human  papillomavirus (HPV). ? More often or less often depending on other medical conditions you have, your age, and other risk factors.  It is your responsibility to get your Pap test results. Ask your health care provider or the department performing the test when your results will be ready.  Keep all follow-up visits as told by your health care provider. This is important. When should I seek medical care? Talk with your health care provider if:  You have any of these: ? Pain or swelling in your legs. ? Shortness of breath. ? Chest pain. ? Lumps or changes in your breasts or armpits. ? Slurred speech. ? Pain, burning, or bleeding when you urine.  You develop any of these: ? Unusual vaginal bleeding. ? Dizziness or headaches. ? Weakness or numbness in any part of your arms or legs. ? Pain in your abdomen.  This information is not intended to replace advice given to you by your health care provider. Make sure you discuss any questions you have with your health care provider. Document Released: 06/18/2003 Document Revised: 08/16/2016 Document Reviewed: 03/23/2015 Elsevier Interactive Patient Education  2017 Elsevier Inc. Menopause Menopause is the normal time of life when menstrual periods stop completely. Menopause is complete when you have missed 12 consecutive menstrual periods. It usually occurs between the ages of 48 years and 55 years. Very rarely does a woman develop menopause before the age of 40 years. At menopause, your ovaries stop producing the female hormones estrogen and progesterone. This can cause undesirable symptoms and also affect your health. Sometimes the symptoms may occur 4-5 years before the menopause begins. There is no relationship between menopause and:  Oral contraceptives.  Number of children you had.  Race.  The age your menstrual periods started (menarche).  Heavy smokers and very thin women may develop menopause earlier in life. What are the  causes?  The ovaries stop producing the female hormones estrogen and progesterone. Other causes include:  Surgery to remove both ovaries.  The ovaries stop functioning for no known reason.  Tumors of the pituitary gland in the brain.  Medical disease that affects the ovaries and hormone production.  Radiation treatment to the abdomen or pelvis.  Chemotherapy that affects the ovaries.  What are the signs or symptoms?  Hot flashes.  Night sweats.  Decrease in sex drive.  Vaginal dryness and thinning of the vagina causing painful intercourse.  Dryness of the skin and developing wrinkles.  Headaches.  Tiredness.  Irritability.  Memory problems.  Weight gain.  Bladder infections.  Hair growth of the face and chest.  Infertility. More serious symptoms include:  Loss of bone (osteoporosis) causing breaks (fractures).  Depression.  Hardening and narrowing of the arteries (atherosclerosis) causing heart attacks and strokes.  How is this diagnosed?  When the menstrual periods have stopped for 12 straight months.  Physical exam.  Hormone studies of the blood. How is this treated? There are many treatment choices and nearly as many questions about them. The decisions to treat or not to treat menopausal changes is an individual choice made with your health care provider. Your health care provider can discuss   the treatments with you. Together, you can decide which treatment will work best for you. Your treatment choices may include:  Hormone therapy (estrogen and progesterone).  Non-hormonal medicines.  Treating the individual symptoms with medicine (for example antidepressants for depression).  Herbal medicines that may help specific symptoms.  Counseling by a psychiatrist or psychologist.  Group therapy.  Lifestyle changes including: ? Eating healthy. ? Regular exercise. ? Limiting caffeine and alcohol. ? Stress management and meditation.  No  treatment.  Follow these instructions at home:  Take the medicine your health care provider gives you as directed.  Get plenty of sleep and rest.  Exercise regularly.  Eat a diet that contains calcium (good for the bones) and soy products (acts like estrogen hormone).  Avoid alcoholic beverages.  Do not smoke.  If you have hot flashes, dress in layers.  Take supplements, calcium, and vitamin D to strengthen bones.  You can use over-the-counter lubricants or moisturizers for vaginal dryness.  Group therapy is sometimes very helpful.  Acupuncture may be helpful in some cases. Contact a health care provider if:  You are not sure you are in menopause.  You are having menopausal symptoms and need advice and treatment.  You are still having menstrual periods after age 55 years.  You have pain with intercourse.  Menopause is complete (no menstrual period for 12 months) and you develop vaginal bleeding.  You need a referral to a specialist (gynecologist, psychiatrist, or psychologist) for treatment. Get help right away if:  You have severe depression.  You have excessive vaginal bleeding.  You fell and think you have a broken bone.  You have pain when you urinate.  You develop leg or chest pain.  You have a fast pounding heart beat (palpitations).  You have severe headaches.  You develop vision problems.  You feel a lump in your breast.  You have abdominal pain or severe indigestion. This information is not intended to replace advice given to you by your health care provider. Make sure you discuss any questions you have with your health care provider. Document Released: 12/10/2003 Document Revised: 02/25/2016 Document Reviewed: 04/18/2013 Elsevier Interactive Patient Education  2017 Elsevier Inc.  

## 2018-01-16 NOTE — Progress Notes (Signed)
Patient ID: Brenda Silva, female   DOB: [birth year], 47 y.o.   MRN: [id number] History of Present Illness:  Brenda Silva is a 47 year old white female,married, perimenopausal in for well woman gyn exam, she had a normal pap with negative HPV 09/06/16.  PCP not currently.   Current Medications, Allergies, Past Medical History, Past Surgical History, Family History and Social History were reviewed in [facility] record.     Review of Systems: Patient denies any headaches, hearing loss, f blurred vision, shortness of breath, chest pain, abdominal pain, problems with bowel movements, urination, or intercourse. No joint pain. +tired +hot flashes Not sleeping well +moody Periods irregular +decreased libido +Hair loss     Physical Exam:BP 110/80 (BP Location: Left Arm, Patient Position: Sitting, Cuff Size: Small)   Pulse 98   Ht 5\' 3"  (1.6 m)   Wt 164 lb (74.4 kg)   LMP 04/29/2017   BMI 29.05 kg/m  General:  Well developed, well nourished, no acute distress Skin:  Warm and dry Neck:  Midline trachea, normal thyroid, good ROM, no lymphadenopathy Lungs; Clear to auscultation bilaterally Breast:  No dominant palpable mass, retraction, or nipple discharge Cardiovascular: Regular rate and rhythm Abdomen:  Soft, non tender, no hepatosplenomegaly Pelvic:  External genitalia is normal in appearance, no lesions.  The vagina is normal in appearance. Urethra has no lesions or masses. The cervix is bulbous.  Uterus is felt to be normal size, shape, and contour.  No adnexal masses or tenderness noted.Bladder is non tender, no masses felt. Rectal: Good sphincter tone, no polyps, or hemorrhoids felt.  Hemoccult negative. Extremities/musculoskeletal:  No swelling or varicosities noted, no clubbing or cyanosis Psych:  No mood changes, alert and cooperative,seems happy PHQ 9 score 8, denies being suicidal, just moody, will try HRT.Discussed HRT vs SSRI, risks and benefits reviewed and  will try HRT.   Impression: 1. Encounter for well woman exam with routine gynecological exam   2. Perimenopausal   3. Hot flashes   4. Moody   5. Hormone replacement therapy (HRT)   6. Screening for colorectal cancer   7. Screening cholesterol level   8. Screening for diabetes mellitus   9. Screening for thyroid disorder   10. Encounter for vitamin deficiency screening       Plan: Check CBC,CMP,TSH and lipids,A1c. Vitamin D 3 Meds ordered this encounter  Medications  . estradiol (VIVELLE-DOT) 0.05 MG/24HR patch    Sig: Place 1 patch (0.05 mg total) onto the skin 2 (two) times a week.    Dispense:  8 patch    Refill:  12    Order Specific Question:   Supervising Provider    Answer:   Elonda Husky, LUTHER H [[id number]]  . progesterone (PROMETRIUM) 200 MG capsule    Sig: Take 1 capsule (200 mg total) by mouth daily.    Dispense:  30 capsule    Refill:  12    Order Specific Question:   Supervising Provider    Answer:   Tania Ade H [[id number]]   F/U in 8 weeks Pap and physical in 1 year Mammogram yearly Colonoscopy at 66.

## 2018-01-17 ENCOUNTER — Telehealth: Payer: Self-pay | Admitting: Adult Health

## 2018-01-17 LAB — CBC
Hematocrit: 40.4 % (ref 34.0–46.6)
Hemoglobin: 13.4 g/dL (ref 11.1–15.9)
MCH: 28.4 pg (ref 26.6–33.0)
MCHC: 33.2 g/dL (ref 31.5–35.7)
MCV: 86 fL (ref 79–97)
Platelets: 390 10*3/uL — ABNORMAL HIGH (ref 150–379)
RBC: 4.72 x10E6/uL (ref 3.77–5.28)
RDW: 13 % (ref 12.3–15.4)
WBC: 7.2 10*3/uL (ref 3.4–10.8)

## 2018-01-17 LAB — COMPREHENSIVE METABOLIC PANEL
ALT: 33 IU/L — ABNORMAL HIGH (ref 0–32)
AST: 23 IU/L (ref 0–40)
Albumin/Globulin Ratio: 1.5 (ref 1.2–2.2)
Albumin: 4.4 g/dL (ref 3.5–5.5)
Alkaline Phosphatase: 106 IU/L (ref 39–117)
BUN/Creatinine Ratio: 13 (ref 9–23)
BUN: 12 mg/dL (ref 6–24)
Bilirubin Total: 0.5 mg/dL (ref 0.0–1.2)
CO2: 25 mmol/L (ref 20–29)
Calcium: 10.1 mg/dL (ref 8.7–10.2)
Chloride: 103 mmol/L (ref 96–106)
Creatinine, Ser: 0.93 mg/dL (ref 0.57–1.00)
GFR calc Af Amer: 83 mL/min/{1.73_m2} (ref >59)
GFR calc non Af Amer: 72 mL/min/{1.73_m2} (ref >59)
Globulin, Total: 3 g/dL (ref 1.5–4.5)
Glucose: 87 mg/dL (ref 65–99)
Potassium: 5.2 mmol/L (ref 3.5–5.2)
Sodium: 143 mmol/L (ref 134–144)
Total Protein: 7.4 g/dL (ref 6.0–8.5)

## 2018-01-17 LAB — LIPID PANEL
Chol/HDL Ratio: 3.6 ratio (ref 0.0–4.4)
Cholesterol, Total: 185 mg/dL (ref 100–199)
HDL: 52 mg/dL (ref >39)
LDL Calculated: 114 mg/dL — ABNORMAL HIGH (ref 0–99)
Triglycerides: 93 mg/dL (ref 0–149)
VLDL Cholesterol Cal: 19 mg/dL (ref 5–40)

## 2018-01-17 LAB — HEMOGLOBIN A1C
Est. average glucose Bld gHb Est-mCnc: 108 mg/dL
Hgb A1c MFr Bld: 5.4 % (ref 4.8–5.6)

## 2018-01-17 LAB — VITAMIN D 25 HYDROXY (VIT D DEFICIENCY, FRACTURES): Vit D, 25-Hydroxy: 35.5 ng/mL (ref 30.0–100.0)

## 2018-01-17 LAB — TSH: TSH: 1.63 u[IU]/mL (ref 0.450–4.500)

## 2018-01-17 NOTE — Telephone Encounter (Signed)
Left message that labs were great

## 2018-03-05 ENCOUNTER — Other Ambulatory Visit: Payer: Self-pay | Admitting: Obstetrics and Gynecology

## 2018-03-05 ENCOUNTER — Other Ambulatory Visit: Payer: Self-pay | Admitting: Adult Health

## 2018-03-05 DIAGNOSIS — Z1231 Encounter for screening mammogram for malignant neoplasm of breast: Secondary | ICD-10-CM

## 2018-03-13 ENCOUNTER — Ambulatory Visit: Payer: BLUE CROSS/BLUE SHIELD | Admitting: Adult Health

## 2018-04-11 ENCOUNTER — Ambulatory Visit: Payer: BLUE CROSS/BLUE SHIELD

## 2018-04-17 ENCOUNTER — Ambulatory Visit
Admission: RE | Admit: 2018-04-17 | Discharge: 2018-04-17 | Disposition: A | Discharge status: Home / Self Care | Payer: BLUE CROSS/BLUE SHIELD | Source: Ambulatory Visit | Attending: Adult Health | Admitting: Adult Health

## 2018-04-17 DIAGNOSIS — Z1231 Encounter for screening mammogram for malignant neoplasm of breast: Secondary | ICD-10-CM

## 2018-08-08 ENCOUNTER — Ambulatory Visit (INDEPENDENT_AMBULATORY_CARE_PROVIDER_SITE_OTHER): Payer: BLUE CROSS/BLUE SHIELD | Admitting: Adult Health

## 2018-08-08 ENCOUNTER — Encounter (INDEPENDENT_AMBULATORY_CARE_PROVIDER_SITE_OTHER): Payer: Self-pay

## 2018-08-08 ENCOUNTER — Encounter: Payer: Self-pay | Admitting: Adult Health

## 2018-08-08 VITALS — BP 126/83 | HR 94 | Ht 63.0 in | Wt 166.0 lb

## 2018-08-08 DIAGNOSIS — R4589 Other symptoms and signs involving emotional state: Secondary | ICD-10-CM | POA: Diagnosis not present

## 2018-08-08 DIAGNOSIS — Z79890 Hormone replacement therapy (postmenopausal): Secondary | ICD-10-CM | POA: Diagnosis not present

## 2018-08-08 DIAGNOSIS — R232 Flushing: Secondary | ICD-10-CM

## 2018-08-08 DIAGNOSIS — Z780 Asymptomatic menopausal state: Secondary | ICD-10-CM

## 2018-08-08 MED ORDER — PROGESTERONE MICRONIZED 200 MG PO CAPS: 200.0000 mg | ORAL_CAPSULE | Freq: Every day | ORAL | 12 refills | Status: DC

## 2018-08-08 MED ORDER — ESTRADIOL 0.05 MG/24HR TD PTTW: 1.0000 | MEDICATED_PATCH | TRANSDERMAL | 12 refills | Status: DC

## 2018-08-08 NOTE — Progress Notes (Addendum)
  Subjective:     Patient ID: Brenda Silva, female   DOB: Jan 25, 1971, 47 y.o.   MRN: [id number]  HPI Brenda Silva is a 47 year old white female, married, in complaining of hot flashes, night sweats, and moody.Had gotten HRT in April but did not start yet.  Review of Systems +hot flashes +night sweats +moody Reviewed past medical,surgical, social and family history. Reviewed medications and allergies.     Objective:   Physical Exam BP 126/83 (BP Location: Left Arm, Patient Position: Sitting, Cuff Size: Normal)   Pulse 94   Ht 5\' 3"  (1.6 m)   Wt 166 lb (75.3 kg)   LMP 04/29/2017   BMI 29.41 kg/m   Talk only: discussed menopausal symptoms and that taking HRT will help, she is aware of risks and benefits.    Assessment:     1. Menopause   2. Hot flashes   3. Moody   4. Hormone replacement therapy (HRT)       Plan:     Meds ordered this encounter  Medications  . estradiol (VIVELLE-DOT) 0.05 MG/24HR patch    Sig: Place 1 patch (0.05 mg total) onto the skin 2 (two) times a week.    Dispense:  8 patch    Refill:  12    Order Specific Question:   Supervising Provider    Answer:   Elonda Husky, LUTHER H [[id number]]  . progesterone (PROMETRIUM) 200 MG capsule    Sig: Take 1 capsule (200 mg total) by mouth daily.    Dispense:  30 capsule    Refill:  12    Order Specific Question:   Supervising Provider    Answer:   Florian Buff [[id number]]  Return in April for pap and physical and ROS, but can call anytime   Review handouts on menopause and HRT

## 2018-08-08 NOTE — Patient Instructions (Signed)
Menopause and Hormone Replacement Therapy What is hormone replacement therapy? Hormone replacement therapy (HRT) is the use of artificial (synthetic) hormones to replace hormones that your body stops producing during menopause. Menopause is the normal time of life when menstrual periods stop completely and the ovaries stop producing the female hormones estrogen and progesterone. This lack of hormones can affect your health and cause undesirable symptoms. HRT can relieve some of those symptoms. What are my options for HRT? HRT may consist of the synthetic hormones estrogen and progestin, or it may consist of only estrogen (estrogen-only therapy). You and your health care provider will decide which form of HRT is best for you. If you choose to be on HRT and you have a uterus, estrogen and progestin are usually prescribed. Estrogen-only therapy is used for women who do not have a uterus. Possible options for taking HRT include:  Pills.  Patches.  Gels.  Sprays.  Vaginal cream.  Vaginal rings.  Vaginal inserts.  The amount of hormone(s) that you take and how long you take the hormone(s) varies depending on your individual health. It is important to:  Begin HRT with the lowest possible dosage.  Stop HRT as soon as your health care provider tells you to stop.  Work with your health care provider so that you feel informed and comfortable with your decisions.  What are the benefits of HRT? HRT can reduce the frequency and severity of menopausal symptoms. Benefits of HRT vary depending on the menopausal symptoms that you have, the severity of your symptoms, and your overall health. HRT may help to improve the following menopausal symptoms:  Hot flashes and night sweats. These are sudden feelings of heat that spread over the face and body. The skin may turn red, like a blush. Night sweats are hot flashes that happen while you are sleeping or trying to sleep.  Bone loss (osteoporosis). The  body loses calcium more quickly after menopause, causing the bones to become weaker. This can increase the risk for bone breaks (fractures).  Vaginal dryness. The lining of the vagina can become thin and dry, which can cause pain during sexual intercourse or cause infection, burning, or itching.  Urinary tract infections.  Urinary incontinence. This is a decreased ability to control when you urinate.  Irritability.  Short-term memory problems.  What are the risks of HRT? Risks of HRT vary depending on your individual health and medical history. Risks of HRT also depend on whether you receive both estrogen and progestin or you receive estrogen only.HRT may increase the risk of:  Spotting. This is when a small amount of bloodleaks from the vagina unexpectedly.  Endometrial cancer. This cancer is in the lining of the uterus (endometrium).  Breast cancer.  Increased density of breast tissue. This can make it harder to find breast cancer on a breast X-ray (mammogram).  Stroke.  Heart attack.  Blood clots.  Gallbladder disease.  Risks of HRT can increase if you have any of the following conditions:  Endometrial cancer.  Liver disease.  Heart disease.  Breast cancer.  History of blood clots.  History of stroke.  How should I care for myself while I am on HRT?  Take over-the-counter and prescription medicines only as told by your health care provider.  Get mammograms, pelvic exams, and medical checkups as often as told by your health care provider.  Have Pap tests done as often as told by your health care provider. A Pap test is sometimes called a Pap   smear. It is a screening test that is used to check for signs of cancer of the cervix and vagina. A Pap test can also identify the presence of infection or precancerous changes. Pap tests may be done: ? Every 3 years, starting at age 21. ? Every 5 years, starting after age 30, in combination with testing for human  papillomavirus (HPV). ? More often or less often depending on other medical conditions you have, your age, and other risk factors.  It is your responsibility to get your Pap test results. Ask your health care provider or the department performing the test when your results will be ready.  Keep all follow-up visits as told by your health care provider. This is important. When should I seek medical care? Talk with your health care provider if:  You have any of these: ? Pain or swelling in your legs. ? Shortness of breath. ? Chest pain. ? Lumps or changes in your breasts or armpits. ? Slurred speech. ? Pain, burning, or bleeding when you urine.  You develop any of these: ? Unusual vaginal bleeding. ? Dizziness or headaches. ? Weakness or numbness in any part of your arms or legs. ? Pain in your abdomen.  This information is not intended to replace advice given to you by your health care provider. Make sure you discuss any questions you have with your health care provider. Document Released: 06/18/2003 Document Revised: 08/16/2016 Document Reviewed: 03/23/2015 Elsevier Interactive Patient Education  2017 Elsevier Inc. Menopause Menopause is the normal time of life when menstrual periods stop completely. Menopause is complete when you have missed 12 consecutive menstrual periods. It usually occurs between the ages of 48 years and 55 years. Very rarely does a woman develop menopause before the age of 40 years. At menopause, your ovaries stop producing the female hormones estrogen and progesterone. This can cause undesirable symptoms and also affect your health. Sometimes the symptoms may occur 4-5 years before the menopause begins. There is no relationship between menopause and:  Oral contraceptives.  Number of children you had.  Race.  The age your menstrual periods started (menarche).  Heavy smokers and very thin women may develop menopause earlier in life. What are the  causes?  The ovaries stop producing the female hormones estrogen and progesterone. Other causes include:  Surgery to remove both ovaries.  The ovaries stop functioning for no known reason.  Tumors of the pituitary gland in the brain.  Medical disease that affects the ovaries and hormone production.  Radiation treatment to the abdomen or pelvis.  Chemotherapy that affects the ovaries.  What are the signs or symptoms?  Hot flashes.  Night sweats.  Decrease in sex drive.  Vaginal dryness and thinning of the vagina causing painful intercourse.  Dryness of the skin and developing wrinkles.  Headaches.  Tiredness.  Irritability.  Memory problems.  Weight gain.  Bladder infections.  Hair growth of the face and chest.  Infertility. More serious symptoms include:  Loss of bone (osteoporosis) causing breaks (fractures).  Depression.  Hardening and narrowing of the arteries (atherosclerosis) causing heart attacks and strokes.  How is this diagnosed?  When the menstrual periods have stopped for 12 straight months.  Physical exam.  Hormone studies of the blood. How is this treated? There are many treatment choices and nearly as many questions about them. The decisions to treat or not to treat menopausal changes is an individual choice made with your health care provider. Your health care provider can discuss   the treatments with you. Together, you can decide which treatment will work best for you. Your treatment choices may include:  Hormone therapy (estrogen and progesterone).  Non-hormonal medicines.  Treating the individual symptoms with medicine (for example antidepressants for depression).  Herbal medicines that may help specific symptoms.  Counseling by a psychiatrist or psychologist.  Group therapy.  Lifestyle changes including: ? Eating healthy. ? Regular exercise. ? Limiting caffeine and alcohol. ? Stress management and meditation.  No  treatment.  Follow these instructions at home:  Take the medicine your health care provider gives you as directed.  Get plenty of sleep and rest.  Exercise regularly.  Eat a diet that contains calcium (good for the bones) and soy products (acts like estrogen hormone).  Avoid alcoholic beverages.  Do not smoke.  If you have hot flashes, dress in layers.  Take supplements, calcium, and vitamin D to strengthen bones.  You can use over-the-counter lubricants or moisturizers for vaginal dryness.  Group therapy is sometimes very helpful.  Acupuncture may be helpful in some cases. Contact a health care provider if:  You are not sure you are in menopause.  You are having menopausal symptoms and need advice and treatment.  You are still having menstrual periods after age 55 years.  You have pain with intercourse.  Menopause is complete (no menstrual period for 12 months) and you develop vaginal bleeding.  You need a referral to a specialist (gynecologist, psychiatrist, or psychologist) for treatment. Get help right away if:  You have severe depression.  You have excessive vaginal bleeding.  You fell and think you have a broken bone.  You have pain when you urinate.  You develop leg or chest pain.  You have a fast pounding heart beat (palpitations).  You have severe headaches.  You develop vision problems.  You feel a lump in your breast.  You have abdominal pain or severe indigestion. This information is not intended to replace advice given to you by your health care provider. Make sure you discuss any questions you have with your health care provider. Document Released: 12/10/2003 Document Revised: 02/25/2016 Document Reviewed: 04/18/2013 Elsevier Interactive Patient Education  2017 Elsevier Inc.  

## 2018-09-10 ENCOUNTER — Telehealth: Payer: Self-pay | Admitting: *Deleted

## 2018-09-10 DIAGNOSIS — N950 Postmenopausal bleeding: Secondary | ICD-10-CM

## 2018-09-10 NOTE — Telephone Encounter (Signed)
Having bleeding now with HRT, but hot flashes and night sweats better, still moody, will get [place] to assess uterus, but keep taking the HRT

## 2018-10-04 ENCOUNTER — Other Ambulatory Visit: Payer: BLUE CROSS/BLUE SHIELD

## 2018-10-23 DIAGNOSIS — L820 Inflamed seborrheic keratosis: Secondary | ICD-10-CM | POA: Diagnosis not present

## 2019-01-23 ENCOUNTER — Other Ambulatory Visit: Payer: BLUE CROSS/BLUE SHIELD | Admitting: Adult Health

## 2019-01-27 ENCOUNTER — Encounter: Payer: Self-pay | Admitting: Adult Health

## 2019-01-27 ENCOUNTER — Encounter: Payer: Self-pay | Admitting: Obstetrics and Gynecology

## 2019-03-07 ENCOUNTER — Other Ambulatory Visit: Payer: Self-pay | Admitting: Adult Health

## 2019-03-07 ENCOUNTER — Other Ambulatory Visit: Payer: Self-pay | Admitting: Nurse Practitioner

## 2019-03-07 DIAGNOSIS — Z1231 Encounter for screening mammogram for malignant neoplasm of breast: Secondary | ICD-10-CM

## 2019-03-21 ENCOUNTER — Telehealth: Payer: Self-pay | Admitting: Adult Health

## 2019-03-21 NOTE — Telephone Encounter (Signed)

## 2019-03-25 ENCOUNTER — Encounter: Payer: Self-pay | Admitting: Adult Health

## 2019-03-25 ENCOUNTER — Other Ambulatory Visit (HOSPITAL_COMMUNITY)
Admission: RE | Admit: 2019-03-25 | Discharge: 2019-03-25 | Disposition: A | Discharge status: Home / Self Care | Payer: BC Managed Care – PPO | Source: Ambulatory Visit | Attending: Adult Health | Admitting: Adult Health

## 2019-03-25 ENCOUNTER — Other Ambulatory Visit: Payer: Self-pay

## 2019-03-25 ENCOUNTER — Ambulatory Visit (INDEPENDENT_AMBULATORY_CARE_PROVIDER_SITE_OTHER): Payer: BC Managed Care – PPO | Admitting: Adult Health

## 2019-03-25 VITALS — BP 123/82 | HR 69 | Ht 63.0 in | Wt 167.0 lb

## 2019-03-25 DIAGNOSIS — Z1211 Encounter for screening for malignant neoplasm of colon: Secondary | ICD-10-CM | POA: Diagnosis not present

## 2019-03-25 DIAGNOSIS — Z1212 Encounter for screening for malignant neoplasm of rectum: Secondary | ICD-10-CM

## 2019-03-25 DIAGNOSIS — Z6829 Body mass index (BMI) 29.0-29.9, adult: Secondary | ICD-10-CM | POA: Insufficient documentation

## 2019-03-25 DIAGNOSIS — Z01419 Encounter for gynecological examination (general) (routine) without abnormal findings: Secondary | ICD-10-CM | POA: Insufficient documentation

## 2019-03-25 DIAGNOSIS — Z713 Dietary counseling and surveillance: Secondary | ICD-10-CM | POA: Insufficient documentation

## 2019-03-25 LAB — HEMOCCULT GUIAC POC 1CARD (OFFICE): Fecal Occult Blood, POC: NEGATIVE

## 2019-03-25 MED ORDER — PHENTERMINE HCL 37.5 MG PO TABS: 37.5000 mg | ORAL_TABLET | Freq: Every day | ORAL | 0 refills | Status: DC

## 2019-03-25 NOTE — Progress Notes (Signed)
.  Patient ID: Brenda Silva, female   DOB: September 20, 1971, 48 y.o.   MRN: [id number] History of Present Illness: Brenda Silva is a 48 year old white female, married, PM in for a well woman gyn exam and she requests labs.She works at [employer] in [employer].   Current Medications, Allergies, Past Medical History, Past Surgical History, Family History and Social History were reviewed in [facility] record.     Review of Systems: Patient denies any headaches, hearing loss, fatigue, blurred vision, shortness of breath, chest pain, abdominal pain, problems with bowel movements, urination, or intercourse(not as often as used to be). No joint pain or mood swings. Can't lose weight.    Physical Exam:BP 123/82 (BP Location: Left Arm, Patient Position: Sitting, Cuff Size: Normal)   Pulse 69   Ht 5\' 3"  (1.6 m)   Wt 167 lb (75.8 kg)   LMP 04/29/2017   BMI 29.58 kg/m  General:  Well developed, well nourished, no acute distress Skin:  Warm and dry Neck:  Midline trachea, normal thyroid, good ROM, no lymphadenopathy Lungs; Clear to auscultation bilaterally Breast:  No dominant palpable mass, retraction, or nipple discharge Cardiovascular: Regular rate and rhythm Abdomen:  Soft, non tender, no hepatosplenomegaly Pelvic:  External genitalia is normal in appearance, no lesions.  The vagina is normal in appearance. Urethra has no lesions or masses. The cervix is bulbous.Pap with HPV performed.  Uterus is felt to be normal size, shape, and contour.  No adnexal masses or tenderness noted.Bladder is non tender, no masses felt. Rectal: Good sphincter tone, no polyps, or hemorrhoids felt.  Hemoccult negative. Extremities/musculoskeletal:  No swelling or varicosities noted, no clubbing or cyanosis Psych:  No mood changes, alert and cooperative,seems happy Fall risk is low. PHQ 2 score 1.  Examination chaperoned by Alice Rieger RN.  Impression: 1. Encounter for gynecological examination with  Papanicolaou smear of cervix   2. Screening for colorectal cancer   3. Weight loss counseling, encounter for   4. Body mass index 29.0-29.9, adult       Plan: Check CBC,CMP,TSH and lipids Meds ordered this encounter  Medications  . phentermine (ADIPEX-P) 37.5 MG tablet    Sig: Take 1 tablet (37.5 mg total) by mouth daily before breakfast.    Dispense:  30 tablet    Refill:  0    Order Specific Question:   Supervising Provider    Answer:   Florian Buff [[id number]]  Mammogram in July Pap in 3 years if normal Physical in 1 year But if loses weight and wants refill needs appt every 4 weeks for weight and BP check

## 2019-03-26 LAB — CYTOLOGY - PAP
Adequacy: ABSENT
Diagnosis: NEGATIVE
HPV: NOT DETECTED

## 2019-04-04 DIAGNOSIS — Z01419 Encounter for gynecological examination (general) (routine) without abnormal findings: Secondary | ICD-10-CM | POA: Diagnosis not present

## 2019-04-05 LAB — COMPREHENSIVE METABOLIC PANEL
ALT: 29 IU/L (ref 0–32)
AST: 21 IU/L (ref 0–40)
Albumin/Globulin Ratio: 2 (ref 1.2–2.2)
Albumin: 4.5 g/dL (ref 3.8–4.8)
Alkaline Phosphatase: 123 IU/L — ABNORMAL HIGH (ref 39–117)
BUN/Creatinine Ratio: 14 (ref 9–23)
BUN: 14 mg/dL (ref 6–24)
Bilirubin Total: 0.4 mg/dL (ref 0.0–1.2)
CO2: 25 mmol/L (ref 20–29)
Calcium: 9.7 mg/dL (ref 8.7–10.2)
Chloride: 101 mmol/L (ref 96–106)
Creatinine, Ser: 1 mg/dL (ref 0.57–1.00)
GFR calc Af Amer: 76 mL/min/{1.73_m2} (ref >59)
GFR calc non Af Amer: 66 mL/min/{1.73_m2} (ref >59)
Globulin, Total: 2.3 g/dL (ref 1.5–4.5)
Glucose: 84 mg/dL (ref 65–99)
Potassium: 4.8 mmol/L (ref 3.5–5.2)
Sodium: 139 mmol/L (ref 134–144)
Total Protein: 6.8 g/dL (ref 6.0–8.5)

## 2019-04-05 LAB — LIPID PANEL
Chol/HDL Ratio: 3.4 ratio (ref 0.0–4.4)
Cholesterol, Total: 178 mg/dL (ref 100–199)
HDL: 52 mg/dL (ref >39)
LDL Calculated: 111 mg/dL — ABNORMAL HIGH (ref 0–99)
Triglycerides: 74 mg/dL (ref 0–149)
VLDL Cholesterol Cal: 15 mg/dL (ref 5–40)

## 2019-04-05 LAB — CBC
Hematocrit: 40.8 % (ref 34.0–46.6)
Hemoglobin: 13.1 g/dL (ref 11.1–15.9)
MCH: 27.6 pg (ref 26.6–33.0)
MCHC: 32.1 g/dL (ref 31.5–35.7)
MCV: 86 fL (ref 79–97)
Platelets: 372 10*3/uL (ref 150–450)
RBC: 4.74 x10E6/uL (ref 3.77–5.28)
RDW: 12.3 % (ref 11.7–15.4)
WBC: 8.1 10*3/uL (ref 3.4–10.8)

## 2019-04-05 LAB — TSH: TSH: 1.89 u[IU]/mL (ref 0.450–4.500)

## 2019-04-09 ENCOUNTER — Telehealth: Payer: Self-pay | Admitting: Adult Health

## 2019-04-09 NOTE — Telephone Encounter (Signed)
Left message that labs looked good, keep doing what you are doing

## 2019-04-25 ENCOUNTER — Ambulatory Visit: Payer: BLUE CROSS/BLUE SHIELD

## 2019-05-07 ENCOUNTER — Telehealth: Payer: Self-pay | Admitting: Adult Health

## 2019-05-07 NOTE — Telephone Encounter (Signed)
Pt states that she is needing her phentermine Refilled. She states she is doing well and not having any issues and is wanting to see if the refill can be sent in?

## 2019-05-08 ENCOUNTER — Other Ambulatory Visit: Payer: Self-pay

## 2019-05-08 ENCOUNTER — Telehealth: Payer: Self-pay | Admitting: Obstetrics & Gynecology

## 2019-05-08 ENCOUNTER — Encounter: Payer: Self-pay | Admitting: Adult Health

## 2019-05-08 ENCOUNTER — Ambulatory Visit (INDEPENDENT_AMBULATORY_CARE_PROVIDER_SITE_OTHER): Payer: BC Managed Care – PPO | Admitting: Adult Health

## 2019-05-08 ENCOUNTER — Ambulatory Visit: Payer: BC Managed Care – PPO

## 2019-05-08 VITALS — BP 114/68 | HR 82 | Ht 63.0 in | Wt 158.4 lb

## 2019-05-08 DIAGNOSIS — Z713 Dietary counseling and surveillance: Secondary | ICD-10-CM | POA: Diagnosis not present

## 2019-05-08 DIAGNOSIS — Z6828 Body mass index (BMI) 28.0-28.9, adult: Secondary | ICD-10-CM

## 2019-05-08 MED ORDER — PHENTERMINE HCL 37.5 MG PO TABS: 37.5000 mg | ORAL_TABLET | Freq: Every day | ORAL | 0 refills | Status: DC

## 2019-05-08 NOTE — Telephone Encounter (Signed)
Left message for pt to call back to schedule.

## 2019-05-08 NOTE — Telephone Encounter (Signed)
error 

## 2019-05-08 NOTE — Progress Notes (Signed)
Patient ID: Brenda Silva, female   DOB: 1971/08/17, 48 y.o.   MRN: [id number] History of Present Illness: Brenda Silva is a 48 year old white female, married, [id number] in for weight and BP check.    Current Medications, Allergies, Past Medical History, Past Surgical History, Family History and Social History were reviewed in [facility] record.     Review of Systems: Denies any headaches, or trouble sleeping, is actually sleeping better     Physical Exam:BP 114/68 (BP Location: Left Arm, Patient Position: Sitting, Cuff Size: Normal)   Pulse 82   Ht 5\' 3"  (1.6 m)   Wt 158 lb 6.4 oz (71.8 kg)   LMP 04/29/2017   BMI 28.06 kg/m  General:  Well developed, well nourished, no acute distress Skin:  Warm and dry Lungs; Clear to auscultation bilaterally Cardiovascular: Regular rate and rhythm Psych:  No mood changes, alert and cooperative,seems happy Has lost 9 lbs, and is happy with results, but hoping for more, no chocolate in 30 days    Impression:  1. Weight loss counseling, encounter for   2. BMI 28.0-28.9,adult      Plan: Will continue adipex, can try one every other day  Meds ordered this encounter  Medications  . phentermine (ADIPEX-P) 37.5 MG tablet    Sig: Take 1 tablet (37.5 mg total) by mouth daily before breakfast.    Dispense:  30 tablet    Refill:  0    Order Specific Question:   Supervising Provider    Answer:   Tania Ade H [[id number]]  Follow up in 8 weeks

## 2019-06-07 ENCOUNTER — Other Ambulatory Visit: Payer: Self-pay

## 2019-06-07 ENCOUNTER — Ambulatory Visit
Admission: RE | Admit: 2019-06-07 | Discharge: 2019-06-07 | Disposition: A | Discharge status: Home / Self Care | Payer: BC Managed Care – PPO | Source: Ambulatory Visit | Attending: Adult Health | Admitting: Adult Health

## 2019-06-07 DIAGNOSIS — Z1231 Encounter for screening mammogram for malignant neoplasm of breast: Secondary | ICD-10-CM | POA: Diagnosis not present

## 2019-07-10 ENCOUNTER — Encounter: Payer: Self-pay | Admitting: Adult Health

## 2019-07-10 ENCOUNTER — Ambulatory Visit: Payer: BC Managed Care – PPO | Admitting: Adult Health

## 2019-07-10 ENCOUNTER — Other Ambulatory Visit: Payer: Self-pay

## 2019-07-10 VITALS — BP 109/71 | HR 80 | Ht 63.0 in | Wt 145.4 lb

## 2019-07-10 DIAGNOSIS — E663 Overweight: Secondary | ICD-10-CM | POA: Insufficient documentation

## 2019-07-10 DIAGNOSIS — Z713 Dietary counseling and surveillance: Secondary | ICD-10-CM

## 2019-07-10 DIAGNOSIS — Z6825 Body mass index (BMI) 25.0-25.9, adult: Secondary | ICD-10-CM | POA: Diagnosis not present

## 2019-07-10 MED ORDER — PHENTERMINE HCL 37.5 MG PO TABS: 37.5000 mg | ORAL_TABLET | Freq: Every day | ORAL | 0 refills | Status: DC

## 2019-07-10 NOTE — Progress Notes (Signed)
  Subjective:     Patient ID: Brenda Silva, female   DOB: 1971-04-08, 48 y.o.   MRN: [id number]  HPI [name] is a 48 year old white female, married, in for weight check, has been on adipex since June not taking everyday and has lost 22 lbs, with 2 prescriptions.  Review of Systems +weight loss with adipex +sleeping better +more active, goes to gym 5 days a week for 1 hour at least, runs 1.5 miles now.    Objective:   Physical Exam BP 109/71 (BP Location: Right Arm, Patient Position: Sitting, Cuff Size: Normal)   Pulse 80   Ht 5\' 3"  (1.6 m)   Wt 145 lb 6.4 oz (66 kg)   LMP 04/29/2017   BMI 25.76 kg/m   Skin warm and dry. Lungs: clear to ausculation bilaterally. Cardiovascular: regular rate and rhythm. She has lost 22 lbs since June.Praised over her efforts.     Assessment:     1. Weight loss counseling, encounter for   2. Body mass index 25.0-25.9, adult       Plan:     Will rx adipex one more time Follow up prn

## 2020-04-28 ENCOUNTER — Other Ambulatory Visit: Payer: Self-pay | Admitting: Adult Health

## 2020-04-28 DIAGNOSIS — Z1231 Encounter for screening mammogram for malignant neoplasm of breast: Secondary | ICD-10-CM

## 2020-05-21 ENCOUNTER — Encounter: Payer: Self-pay | Admitting: Adult Health

## 2020-05-21 ENCOUNTER — Ambulatory Visit (INDEPENDENT_AMBULATORY_CARE_PROVIDER_SITE_OTHER): Payer: BC Managed Care – PPO | Admitting: Adult Health

## 2020-05-21 ENCOUNTER — Other Ambulatory Visit: Payer: Self-pay

## 2020-05-21 VITALS — BP 117/66 | HR 70 | Ht 63.0 in | Wt 144.4 lb

## 2020-05-21 DIAGNOSIS — R232 Flushing: Secondary | ICD-10-CM | POA: Diagnosis not present

## 2020-05-21 DIAGNOSIS — Z01419 Encounter for gynecological examination (general) (routine) without abnormal findings: Secondary | ICD-10-CM

## 2020-05-21 DIAGNOSIS — Z0000 Encounter for general adult medical examination without abnormal findings: Secondary | ICD-10-CM | POA: Insufficient documentation

## 2020-05-21 DIAGNOSIS — Z1211 Encounter for screening for malignant neoplasm of colon: Secondary | ICD-10-CM | POA: Diagnosis not present

## 2020-05-21 LAB — HEMOCCULT GUIAC POC 1CARD (OFFICE): Fecal Occult Blood, POC: NEGATIVE

## 2020-05-21 MED ORDER — ESTRADIOL-LEVONORGESTREL 0.045-0.015 MG/DAY TD PTWK: 1.0000 | MEDICATED_PATCH | TRANSDERMAL | 12 refills | Status: DC

## 2020-05-21 NOTE — Progress Notes (Signed)
Patient ID: Brenda Silva, female   DOB: [birth year], 49 y.o.   MRN: [id number] History of Present Illness: Brenda Silva is a 49 year old white female, married, PM in for well woman gyn exam. She had a normal pap with negative HPV 6/22/22020. She says she is having hot flashes and sweats and is tired. She is still working at [employer] 5 am to 10 am.  Current Medications, Allergies, Past Medical History, Past Surgical History, Family History and Social History were reviewed in [facility] record.     Review of Systems:  Patient denies any headaches, hearing loss, , blurred vision, shortness of breath, chest pain, abdominal pain, problems with bowel movements, urination, or intercourse. No joint pain or mood swings. +hot flashes +sweats +tired.    Physical Exam:BP 117/66 (BP Location: Right Arm, Patient Position: Sitting, Cuff Size: Normal)   Pulse 70   Ht 5\' 3"  (1.6 m)   Wt 144 lb 6.4 oz (65.5 kg)   LMP 04/29/2017   BMI 25.58 kg/m  General:  Well developed, well nourished, no acute distress Skin:  Warm and dry Neck:  Midline trachea, normal thyroid, good ROM, no lymphadenopathy Lungs; Clear to auscultation bilaterally Breast:  No dominant palpable mass, retraction, or nipple discharge Cardiovascular: Regular rate and rhythm Abdomen:  Soft, non tender, no hepatosplenomegaly Pelvic:  External genitalia is normal in appearance, no lesions.  The vagina is normal in appearance. Urethra has no lesions or masses. The cervix is bulbous.  Uterus is felt to be normal size, shape, and contour.  No adnexal masses or tenderness noted.Bladder is non tender, no masses felt. Rectal: Good sphincter tone, no polyps, or hemorrhoids felt.  Hemoccult negative. Extremities/musculoskeletal:  No swelling or varicosities noted, no clubbing or cyanosis Psych:  No mood changes, alert and cooperative,seems happy AA is 0 Fall risk is low PHQ 9 score is 8, no SI, denies being depressed  Upstream -  05/21/20 0948      Pregnancy Intention Screening   Does the patient want to become pregnant in the next year? No    Does the patient's partner want to become pregnant in the next year? No    Would the patient like to discuss contraceptive options today? No      Contraception Wrap Up   Current Method Female Sterilization    End Method Female Sterilization    Contraception Counseling Provided No         Examination chaperoned by Celene Squibb LPN  Impression and Plan: 1. Encounter for well woman exam with routine gynecological exam Physical in 1 year Pap in 2023 Check CBC,CMP,TSH and lipids Mammogram yearly  2. Encounter for screening fecal occult blood testing   3. Hot flashes Check FSH Will try HRT patch Meds ordered this encounter  Medications  . estradiol-levonorgestrel (CLIMARAPRO) 0.045-0.015 MG/DAY    Sig: Place 1 patch onto the skin once a week.    Dispense:  4 patch    Refill:  12    Order Specific Question:   Supervising Provider    Answer:   Florian Buff [[id number]]  Let me know in few weeks if helps

## 2020-05-22 LAB — COMPREHENSIVE METABOLIC PANEL
ALT: 66 IU/L — ABNORMAL HIGH (ref 0–32)
AST: 39 IU/L (ref 0–40)
Albumin/Globulin Ratio: 1.7 (ref 1.2–2.2)
Albumin: 4.7 g/dL (ref 3.8–4.9)
Alkaline Phosphatase: 167 IU/L — ABNORMAL HIGH (ref 48–121)
BUN/Creatinine Ratio: 18 (ref 9–23)
BUN: 17 mg/dL (ref 6–24)
Bilirubin Total: 0.4 mg/dL (ref 0.0–1.2)
CO2: 26 mmol/L (ref 20–29)
Calcium: 10.2 mg/dL (ref 8.7–10.2)
Chloride: 101 mmol/L (ref 96–106)
Creatinine, Ser: 0.95 mg/dL (ref 0.57–1.00)
GFR calc Af Amer: 80 mL/min/{1.73_m2} (ref >59)
GFR calc non Af Amer: 70 mL/min/{1.73_m2} (ref >59)
Globulin, Total: 2.7 g/dL (ref 1.5–4.5)
Glucose: 86 mg/dL (ref 65–99)
Potassium: 4.9 mmol/L (ref 3.5–5.2)
Sodium: 139 mmol/L (ref 134–144)
Total Protein: 7.4 g/dL (ref 6.0–8.5)

## 2020-05-22 LAB — CBC
Hematocrit: 43.3 % (ref 34.0–46.6)
Hemoglobin: 14 g/dL (ref 11.1–15.9)
MCH: 28.5 pg (ref 26.6–33.0)
MCHC: 32.3 g/dL (ref 31.5–35.7)
MCV: 88 fL (ref 79–97)
Platelets: 382 10*3/uL (ref 150–450)
RBC: 4.91 x10E6/uL (ref 3.77–5.28)
RDW: 11.8 % (ref 11.7–15.4)
WBC: 9.2 10*3/uL (ref 3.4–10.8)

## 2020-05-22 LAB — LIPID PANEL
Chol/HDL Ratio: 3.4 ratio (ref 0.0–4.4)
Cholesterol, Total: 190 mg/dL (ref 100–199)
HDL: 56 mg/dL (ref >39)
LDL Chol Calc (NIH): 118 mg/dL — ABNORMAL HIGH (ref 0–99)
Triglycerides: 88 mg/dL (ref 0–149)
VLDL Cholesterol Cal: 16 mg/dL (ref 5–40)

## 2020-05-22 LAB — FOLLICLE STIMULATING HORMONE: FSH: 73.2 m[IU]/mL

## 2020-05-22 LAB — TSH: TSH: 2.14 u[IU]/mL (ref 0.450–4.500)

## 2020-05-25 ENCOUNTER — Telehealth: Payer: Self-pay | Admitting: Adult Health

## 2020-05-25 DIAGNOSIS — R7401 Elevation of levels of liver transaminase levels: Secondary | ICD-10-CM | POA: Insufficient documentation

## 2020-05-25 NOTE — Telephone Encounter (Signed)
Pt aware of labs, ALT was 66 and alk phos was elevated,will recheck,CMP, in 8 weeks, other labs looked good  FSH 73.2, so PM, she got patches and put first one on Saturday night  She denies any alcohol use or tylenol use

## 2020-05-25 NOTE — Telephone Encounter (Signed)
Left message to call me about labs

## 2020-06-11 ENCOUNTER — Ambulatory Visit
Admission: RE | Admit: 2020-06-11 | Discharge: 2020-06-11 | Disposition: A | Discharge status: Home / Self Care | Payer: BC Managed Care – PPO | Source: Ambulatory Visit | Attending: Adult Health | Admitting: Adult Health

## 2020-06-11 ENCOUNTER — Other Ambulatory Visit: Payer: Self-pay

## 2020-06-11 DIAGNOSIS — Z1231 Encounter for screening mammogram for malignant neoplasm of breast: Secondary | ICD-10-CM

## 2020-07-07 ENCOUNTER — Telehealth: Payer: Self-pay | Admitting: Adult Health

## 2020-07-07 DIAGNOSIS — R7401 Elevation of levels of liver transaminase levels: Secondary | ICD-10-CM

## 2020-07-07 NOTE — Telephone Encounter (Signed)
Patient called stating that she was told back in June by Anderson Malta that she would like her to come in October for blood ork. Pt would like to come in tomorrow for blood work, pt would like [facility] to place the order in for her.

## 2020-07-07 NOTE — Addendum Note (Signed)
Addended by: Derrek Monaco A on: 07/07/2020 03:08 PM   Modules accepted: Orders

## 2020-07-07 NOTE — Telephone Encounter (Signed)
Check CMP tomorrow

## 2020-07-10 DIAGNOSIS — R7401 Elevation of levels of liver transaminase levels: Secondary | ICD-10-CM | POA: Diagnosis not present

## 2020-07-11 LAB — COMPREHENSIVE METABOLIC PANEL
ALT: 19 IU/L (ref 0–32)
AST: 23 IU/L (ref 0–40)
Albumin/Globulin Ratio: 1.7 (ref 1.2–2.2)
Albumin: 4.3 g/dL (ref 3.8–4.9)
Alkaline Phosphatase: 109 IU/L (ref 44–121)
BUN/Creatinine Ratio: 21 (ref 9–23)
BUN: 19 mg/dL (ref 6–24)
Bilirubin Total: 0.3 mg/dL (ref 0.0–1.2)
CO2: 25 mmol/L (ref 20–29)
Calcium: 9.4 mg/dL (ref 8.7–10.2)
Chloride: 100 mmol/L (ref 96–106)
Creatinine, Ser: 0.92 mg/dL (ref 0.57–1.00)
GFR calc Af Amer: 83 mL/min/{1.73_m2} (ref >59)
GFR calc non Af Amer: 72 mL/min/{1.73_m2} (ref >59)
Globulin, Total: 2.6 g/dL (ref 1.5–4.5)
Glucose: 79 mg/dL (ref 65–99)
Potassium: 3.9 mmol/L (ref 3.5–5.2)
Sodium: 140 mmol/L (ref 134–144)
Total Protein: 6.9 g/dL (ref 6.0–8.5)

## 2020-07-15 ENCOUNTER — Telehealth: Payer: Self-pay | Admitting: Adult Health

## 2020-07-15 NOTE — Telephone Encounter (Signed)
Left message that liver enzymes back to normal

## 2020-07-20 ENCOUNTER — Other Ambulatory Visit: Payer: Self-pay

## 2020-07-20 ENCOUNTER — Ambulatory Visit
Admission: RE | Admit: 2020-07-20 | Discharge: 2020-07-20 | Disposition: A | Discharge status: Home / Self Care | Payer: BC Managed Care – PPO | Source: Ambulatory Visit | Attending: Adult Health | Admitting: Adult Health

## 2020-07-20 DIAGNOSIS — Z1231 Encounter for screening mammogram for malignant neoplasm of breast: Secondary | ICD-10-CM | POA: Diagnosis not present

## 2020-07-23 ENCOUNTER — Other Ambulatory Visit: Payer: Self-pay | Admitting: Adult Health

## 2020-07-23 DIAGNOSIS — R928 Other abnormal and inconclusive findings on diagnostic imaging of breast: Secondary | ICD-10-CM

## 2020-07-31 ENCOUNTER — Telehealth: Payer: Self-pay | Admitting: Adult Health

## 2020-07-31 NOTE — Telephone Encounter (Signed)
Patient has questions regarding hormone patches.

## 2020-07-31 NOTE — Telephone Encounter (Signed)
Pt spotting on patch if persists will get [place] Has to et followup on mammogram,had asymmetry

## 2020-08-07 ENCOUNTER — Other Ambulatory Visit: Payer: Self-pay

## 2020-08-07 ENCOUNTER — Ambulatory Visit
Admission: RE | Admit: 2020-08-07 | Discharge: 2020-08-07 | Disposition: A | Discharge status: Home / Self Care | Payer: BC Managed Care – PPO | Source: Ambulatory Visit | Attending: Adult Health | Admitting: Adult Health

## 2020-08-07 DIAGNOSIS — R928 Other abnormal and inconclusive findings on diagnostic imaging of breast: Secondary | ICD-10-CM

## 2020-08-07 DIAGNOSIS — N6011 Diffuse cystic mastopathy of right breast: Secondary | ICD-10-CM | POA: Diagnosis not present

## 2020-09-15 ENCOUNTER — Other Ambulatory Visit: Payer: Self-pay | Admitting: Adult Health

## 2020-09-15 DIAGNOSIS — R928 Other abnormal and inconclusive findings on diagnostic imaging of breast: Secondary | ICD-10-CM

## 2020-10-01 ENCOUNTER — Telehealth: Payer: Self-pay | Admitting: Adult Health

## 2020-10-01 MED ORDER — SCOPOLAMINE 1 MG/3DAYS TD PT72: 1.0000 | MEDICATED_PATCH | TRANSDERMAL | 0 refills | Status: DC

## 2020-10-01 NOTE — Telephone Encounter (Signed)
Patient wants to know if NP Valentina Lucks could call her in a prescription for motion sickness for a Cruise Trip or do she needs to get an appt made. Clinical staff will follow up wit patient.

## 2020-10-01 NOTE — Telephone Encounter (Signed)
Left message I called , and sent rx in for transderm scop patch

## 2020-10-07 ENCOUNTER — Telehealth: Payer: Self-pay | Admitting: Adult Health

## 2020-10-07 NOTE — Telephone Encounter (Signed)
Patient would like a call back from NP [place], to discuss her upcoming trip. Patient has a few questions, per patient. Clinical Staff will follow up with patient.

## 2020-10-07 NOTE — Telephone Encounter (Signed)
She is going a cruise next week, scop patch already called in. Going with friend Lynden Ang. She had J&J vaccine and wears mask and watches hands often.

## 2020-11-10 ENCOUNTER — Other Ambulatory Visit: Payer: Self-pay

## 2020-11-10 ENCOUNTER — Ambulatory Visit
Admission: RE | Admit: 2020-11-10 | Discharge: 2020-11-10 | Disposition: A | Discharge status: Home / Self Care | Payer: BC Managed Care – PPO | Source: Ambulatory Visit | Attending: Adult Health | Admitting: Adult Health

## 2020-11-10 DIAGNOSIS — R928 Other abnormal and inconclusive findings on diagnostic imaging of breast: Secondary | ICD-10-CM | POA: Diagnosis not present

## 2020-11-11 ENCOUNTER — Telehealth: Payer: Self-pay | Admitting: Adult Health

## 2020-11-11 NOTE — Telephone Encounter (Signed)
Pt aware of mammogram

## 2020-11-20 DIAGNOSIS — M25561 Pain in right knee: Secondary | ICD-10-CM | POA: Diagnosis not present

## 2021-01-01 DIAGNOSIS — M25561 Pain in right knee: Secondary | ICD-10-CM | POA: Diagnosis not present

## 2021-01-05 DIAGNOSIS — Z20822 Contact with and (suspected) exposure to covid-19: Secondary | ICD-10-CM | POA: Diagnosis not present

## 2021-01-05 DIAGNOSIS — Z03818 Encounter for observation for suspected exposure to other biological agents ruled out: Secondary | ICD-10-CM | POA: Diagnosis not present

## 2021-03-16 DIAGNOSIS — L820 Inflamed seborrheic keratosis: Secondary | ICD-10-CM | POA: Diagnosis not present

## 2021-03-16 DIAGNOSIS — D485 Neoplasm of uncertain behavior of skin: Secondary | ICD-10-CM | POA: Diagnosis not present

## 2021-03-16 DIAGNOSIS — L814 Other melanin hyperpigmentation: Secondary | ICD-10-CM | POA: Diagnosis not present

## 2021-04-13 DIAGNOSIS — M25561 Pain in right knee: Secondary | ICD-10-CM | POA: Diagnosis not present

## 2021-05-05 ENCOUNTER — Ambulatory Visit: Payer: BC Managed Care – PPO | Admitting: Dermatology

## 2021-05-28 ENCOUNTER — Other Ambulatory Visit: Payer: BC Managed Care – PPO | Admitting: Adult Health

## 2021-06-23 ENCOUNTER — Other Ambulatory Visit: Payer: Self-pay | Admitting: Adult Health

## 2021-06-23 DIAGNOSIS — Z1231 Encounter for screening mammogram for malignant neoplasm of breast: Secondary | ICD-10-CM

## 2021-07-13 ENCOUNTER — Encounter: Payer: Self-pay | Admitting: Adult Health

## 2021-07-13 ENCOUNTER — Other Ambulatory Visit (HOSPITAL_COMMUNITY)
Admission: RE | Admit: 2021-07-13 | Discharge: 2021-07-13 | Disposition: A | Discharge status: Home / Self Care | Payer: BC Managed Care – PPO | Source: Ambulatory Visit | Attending: Adult Health | Admitting: Adult Health

## 2021-07-13 ENCOUNTER — Ambulatory Visit (INDEPENDENT_AMBULATORY_CARE_PROVIDER_SITE_OTHER): Payer: BC Managed Care – PPO | Admitting: Adult Health

## 2021-07-13 ENCOUNTER — Other Ambulatory Visit: Payer: Self-pay

## 2021-07-13 VITALS — BP 125/82 | HR 64 | Ht 63.0 in | Wt 161.0 lb

## 2021-07-13 DIAGNOSIS — Z01419 Encounter for gynecological examination (general) (routine) without abnormal findings: Secondary | ICD-10-CM | POA: Diagnosis not present

## 2021-07-13 DIAGNOSIS — Z1211 Encounter for screening for malignant neoplasm of colon: Secondary | ICD-10-CM | POA: Diagnosis not present

## 2021-07-13 DIAGNOSIS — R232 Flushing: Secondary | ICD-10-CM

## 2021-07-13 DIAGNOSIS — N950 Postmenopausal bleeding: Secondary | ICD-10-CM | POA: Diagnosis not present

## 2021-07-13 LAB — HEMOCCULT GUIAC POC 1CARD (OFFICE): Fecal Occult Blood, POC: NEGATIVE

## 2021-07-13 NOTE — Progress Notes (Signed)
Patient ID: Brenda Silva, female   DOB: 02-13-1971, 50 y.o.   MRN: [id number] History of Present Illness: Brenda Silva is a 50 year old white female,married, PM in for well woman gyn exam and she requests pap, gets money from work for doing. She stopped HRT patch, had some bleeding on it, none since. Still having hot flashes. +stress son getting married 08/07/21   Current Medications, Allergies, Past Medical History, Past Surgical History, Family History and Social History were reviewed in [employer] record.     Review of Systems: Patient denies any headaches, hearing loss, fatigue, blurred vision, shortness of breath, chest pain, abdominal pain, problems with bowel movements, urination, or intercourse. No joint pain or mood swings.  +hot flashes Had PMB on patch ,none since stopping. +stress  Physical Exam:BP 125/82 (BP Location: Right Arm, Patient Position: Sitting, Cuff Size: Normal)   Pulse 64   Ht 5\' 3"  (1.6 m)   Wt 161 lb (73 kg)   LMP 04/29/2017   BMI 28.52 kg/m   General:  Well developed, well nourished, no acute distress Skin:  Warm and dry Neck:  Midline trachea, normal thyroid, good ROM, no lymphadenopathy Lungs; Clear to auscultation bilaterally Breast:  No dominant palpable mass, retraction, or nipple discharge Cardiovascular: Regular rate and rhythm Abdomen:  Soft, non tender, no hepatosplenomegaly Pelvic:  External genitalia is normal in appearance, no lesions.  The vagina is normal in appearance. Urethra has no lesions or masses. The cervix is bulbous.Pap with HR HPV genotyping performed.  Uterus is felt to be normal size, shape, and contour.  No adnexal masses or tenderness noted.Bladder is non tender, no masses felt. Rectal: Good sphincter tone, no polyps, or hemorrhoids felt.  Hemoccult negative. Extremities/musculoskeletal:  No swelling or varicosities noted, no clubbing or cyanosis Psych:  No mood changes, alert and cooperative,seems happy AA is  0  Fall risk is low Depression screen [facility] 2/9 07/13/2021 05/21/2020 03/25/2019  Decreased Interest 2 1 1   Down, Depressed, Hopeless 1 0 0  PHQ - 2 Score 3 1 1   Altered sleeping 3 3 -  Tired, decreased energy 3 3 -  Change in appetite 1 0 -  Feeling bad or failure about yourself  0 0 -  Trouble concentrating 0 1 -  Moving slowly or fidgety/restless 0 0 -  Suicidal thoughts 0 0 -  PHQ-9 Score 10 8 -  Difficult doing work/chores - Not difficult at all -    GAD 7 : Generalized Anxiety Score 07/13/2021 05/21/2020  Nervous, Anxious, on Edge 0 0  Control/stop worrying 0 0  Worry too much - different things 0 0  Trouble relaxing 1 0  Restless 1 1  Easily annoyed or irritable 3 3  Afraid - awful might happen 0 0  Total GAD 7 Score 5 4  Anxiety Difficulty - Not difficult at all      Upstream - 07/13/21 0929       Pregnancy Intention Screening   Does the patient want to become pregnant in the next year? N/A    Does the patient's partner want to become pregnant in the next year? N/A    Would the patient like to discuss contraceptive options today? No      Contraception Wrap Up   Current Method Female Sterilization    End Method Female Sterilization    Contraception Counseling Provided No            Examination chaperoned by [employer].  Impression  and Plan: 1. Encounter for gynecological examination with Papanicolaou smear of cervix Pap sent Physical in 1 year Pap in 3 if normal Mammogram this month and yearly Encouraged Cologuard   2. Encounter for screening fecal occult blood testing  3. Hot flashes   4. PMB (postmenopausal bleeding) Will get pelvic US in about 4 weeks to assess uterine lining

## 2021-07-14 LAB — CYTOLOGY - PAP
Comment: NEGATIVE
Diagnosis: NEGATIVE
High risk HPV: NEGATIVE

## 2021-07-28 ENCOUNTER — Ambulatory Visit: Payer: BC Managed Care – PPO

## 2021-08-10 ENCOUNTER — Other Ambulatory Visit: Payer: Self-pay

## 2021-08-10 ENCOUNTER — Ambulatory Visit (INDEPENDENT_AMBULATORY_CARE_PROVIDER_SITE_OTHER): Payer: BC Managed Care – PPO

## 2021-08-10 DIAGNOSIS — N950 Postmenopausal bleeding: Secondary | ICD-10-CM | POA: Diagnosis not present

## 2021-08-10 NOTE — Progress Notes (Signed)
PELVIC US TA/TV: homogeneous anteverted uterus,WNL,EEC 2.1 mm,normal ovaries,ovaries appear mobile,no free fluid

## 2021-11-11 ENCOUNTER — Ambulatory Visit
Admission: RE | Admit: 2021-11-11 | Discharge: 2021-11-11 | Disposition: A | Discharge status: Home / Self Care | Payer: BC Managed Care – PPO | Source: Ambulatory Visit | Attending: Adult Health | Admitting: Adult Health

## 2021-11-11 DIAGNOSIS — Z1231 Encounter for screening mammogram for malignant neoplasm of breast: Secondary | ICD-10-CM | POA: Diagnosis not present

## 2022-01-17 ENCOUNTER — Telehealth: Payer: Self-pay | Admitting: Adult Health

## 2022-01-17 NOTE — Telephone Encounter (Signed)
Returned pt's call for clarification. Two identifiers used. Pt stated that she only used the estrogen patch for 1 month and stopped because she had some bleeding, but she wants to try again. She stated that the menopause symptoms are too much to deal with and hopes the bleeding is only during an adjustment period. ?

## 2022-01-17 NOTE — Telephone Encounter (Signed)
Patient wants to try the hormonal patches (scopolamine) again. Please advise.  ?

## 2022-01-19 NOTE — Telephone Encounter (Signed)
Called patient back.  She is still noting considerable hot flashes and interested in restarting medication; however she stopped HRT due to bleeding.  Advised she make an appt with Electa Sniff to review her options- may consider Gabapentin or Brisdelle instead. ? ?Janyth Pupa, DO ?Attending [place], Faculty Practice ?[facility] ? ? ?

## 2022-01-25 ENCOUNTER — Ambulatory Visit: Payer: BC Managed Care – PPO | Admitting: Adult Health

## 2022-03-03 ENCOUNTER — Ambulatory Visit: Payer: BC Managed Care – PPO | Admitting: Nurse Practitioner

## 2022-03-03 ENCOUNTER — Encounter: Payer: Self-pay | Admitting: Nurse Practitioner

## 2022-03-03 VITALS — BP 126/80 | HR 91 | Temp 97.5°F | Ht 63.0 in | Wt 165.0 lb

## 2022-03-03 DIAGNOSIS — Z1211 Encounter for screening for malignant neoplasm of colon: Secondary | ICD-10-CM | POA: Diagnosis not present

## 2022-03-03 DIAGNOSIS — E663 Overweight: Secondary | ICD-10-CM | POA: Diagnosis not present

## 2022-03-03 DIAGNOSIS — Z23 Encounter for immunization: Secondary | ICD-10-CM | POA: Insufficient documentation

## 2022-03-03 DIAGNOSIS — Z0000 Encounter for general adult medical examination without abnormal findings: Secondary | ICD-10-CM | POA: Diagnosis not present

## 2022-03-03 LAB — COMPREHENSIVE METABOLIC PANEL
ALT: 28 U/L (ref 0–35)
AST: 26 U/L (ref 0–37)
Albumin: 4.4 g/dL (ref 3.5–5.2)
Alkaline Phosphatase: 109 U/L (ref 39–117)
BUN: 14 mg/dL (ref 6–23)
CO2: 29 mEq/L (ref 19–32)
Calcium: 10.3 mg/dL (ref 8.4–10.5)
Chloride: 104 mEq/L (ref 96–112)
Creatinine, Ser: 0.96 mg/dL (ref 0.40–1.20)
GFR: 67.62 mL/min (ref >60.00)
Glucose, Bld: 87 mg/dL (ref 70–99)
Potassium: 5.2 mEq/L — ABNORMAL HIGH (ref 3.5–5.1)
Sodium: 142 mEq/L (ref 135–145)
Total Bilirubin: 0.5 mg/dL (ref 0.2–1.2)
Total Protein: 7.2 g/dL (ref 6.0–8.3)

## 2022-03-03 LAB — HEMOGLOBIN A1C: Hgb A1c MFr Bld: 5.6 % (ref 4.6–6.5)

## 2022-03-03 LAB — LIPID PANEL
Cholesterol: 224 mg/dL — ABNORMAL HIGH (ref 0–200)
HDL: 52.2 mg/dL (ref >39.00)
LDL Cholesterol: 146 mg/dL — ABNORMAL HIGH (ref 0–99)
NonHDL: 171.93
Total CHOL/HDL Ratio: 4
Triglycerides: 132 mg/dL (ref 0.0–149.0)
VLDL: 26.4 mg/dL (ref 0.0–40.0)

## 2022-03-03 LAB — CBC
HCT: 41.5 % (ref 36.0–46.0)
Hemoglobin: 13.7 g/dL (ref 12.0–15.0)
MCHC: 32.9 g/dL (ref 30.0–36.0)
MCV: 88 fl (ref 78.0–100.0)
Platelets: 361 10*3/uL (ref 150.0–400.0)
RBC: 4.71 Mil/uL (ref 3.87–5.11)
RDW: 12.8 % (ref 11.5–15.5)
WBC: 10.6 10*3/uL — ABNORMAL HIGH (ref 4.0–10.5)

## 2022-03-03 LAB — TSH: TSH: 2.95 u[IU]/mL (ref 0.35–5.50)

## 2022-03-03 NOTE — Progress Notes (Signed)
New Patient Office Visit  Subjective    Patient ID: Brenda Silva, female    DOB: 1971-08-01  Age: 51 y.o. MRN: [id number]  CC:  Chief Complaint  Patient presents with   New Patient (Initial Visit)    HPI [name] presents to establish care   Weight gain: states that over the past 3 months. Gym and cardio for 3-4 times a week at 1-1.5 hours  for complete physical and follow up of chronic conditions.  Immunizations: -Tetanus: update today  -Influenza: out of season  -Covid-19:J&J -Shingles: refused -Pneumonia: too young  -HPV: aged out  Diet: [name]. Grazer through the day. Bored eating with snacking. Water mainly Exercise:  see above  Eye exam: Completes annually. [facility]  Dental exam: Completes semi-annually   Pap Smear: Completed in 6 months Mammogram: Completed in 11/11/2021  Colonoscopy: Completed in never. [place] Lung Cancer Screening: NA Dexa: NA   Sleep: bed time is 10-6 and feels rested. Intermittent snore    Outpatient Encounter Medications as of 03/03/2022  Medication Sig   Melatonin 10 MG TABS Take 10 mg by mouth as needed.   [DISCONTINUED] Turmeric (QC TUMERIC COMPLEX) 500 MG CAPS Take by mouth.   No facility-administered encounter medications on file as of 03/03/2022.    Past Medical History:  Diagnosis Date   Abnormal Pap smear    Eczema    GERD (gastroesophageal reflux disease)    History of shingles    HPV (human papilloma virus) infection    Hx of gonorrhea    PMS (premenstrual syndrome)    Seborrheic dermatitis 01/29/2013   Vaginal Pap smear, abnormal     Past Surgical History:  Procedure Laterality Date   DILATION AND CURETTAGE OF UTERUS     TUBAL LIGATION     WISDOM TOOTH EXTRACTION      Family History  Problem Relation Age of Onset   Cancer Mother 62       breast and thyroid   Breast cancer Mother 30    Social History   Socioeconomic History   Marital status: Married    Spouse name: Not on  file   Number of children: 4   Years of education: Not on file   Highest education level: Not on file  Occupational History   Not on file  Tobacco Use   Smoking status: Never   Smokeless tobacco: Never  Vaping Use   Vaping Use: Never used  Substance and Sexual Activity   Alcohol use: No   Drug use: No   Sexual activity: Yes    Birth control/protection: Surgical, Post-menopausal    Comment: tubal  Other Topics Concern   Not on file  Social History Narrative   Fulltime: Materials engineer      Hobbies: riding bicycles, gym   Social Determinants of Health   Financial Resource Strain: Low Risk    Difficulty of Paying Living Expenses: Not hard at all  Food Insecurity: No Food Insecurity   Worried About Charity fundraiser in the Last Year: Never true   Ran Out of Food in the Last Year: Never true  Transportation Needs: No Transportation Needs   Lack of Transportation (Medical): No   Lack of Transportation (Non-Medical): No  Physical Activity: Sufficiently Active   Days of Exercise per Week: 4 days   Minutes of Exercise per Session: 40 min  Stress: Stress Concern Present   Feeling of Stress : To some extent  Social Connections:  Moderately Integrated   Frequency of Communication with Friends and Family: More than three times a week   Frequency of Social Gatherings with Friends and Family: Once a week   Attends Religious Services: More than 4 times per year   Active Member of [employer] or Organizations: No   Attends Archivist Meetings: Never   Marital Status: Married  Human resources officer Violence: Not At Risk   Fear of Current or Ex-Partner: No   Emotionally Abused: No   Physically Abused: No   Sexually Abused: No    Review of Systems  Constitutional:  Negative for chills, fever and malaise/fatigue.  Respiratory:  Negative for cough and shortness of breath.   Cardiovascular:  Negative for chest pain and leg swelling.  Gastrointestinal:  Negative for constipation,  diarrhea, nausea and vomiting.       BM every other day ish  Genitourinary:  Negative for dysuria and hematuria.       Nocturia intermittment   Neurological:  Negative for tingling and headaches.  Psychiatric/Behavioral:  Negative for hallucinations and suicidal ideas.        Objective    BP 126/80   Pulse 91   Temp (!) 97.5 F (36.4 C) (Temporal)   Ht '5\' 3"'$  (1.6 m)   Wt 165 lb (74.8 kg)   LMP 04/29/2017   SpO2 98%   BMI 29.23 kg/m   Physical Exam Vitals and nursing note reviewed.  Constitutional:      Appearance: Normal appearance.  HENT:     Right Ear: Tympanic membrane and external ear normal.     Left Ear: Tympanic membrane and external ear normal.     Mouth/Throat:     Mouth: Mucous membranes are moist.     Pharynx: Oropharynx is clear.  Eyes:     Extraocular Movements: Extraocular movements intact.     Pupils: Pupils are equal, round, and reactive to light.     Comments: Wears glasses  Cardiovascular:     Rate and Rhythm: Normal rate and regular rhythm.     Pulses: Normal pulses.     Heart sounds: Normal heart sounds.  Pulmonary:     Effort: Pulmonary effort is normal.     Breath sounds: Normal breath sounds.  Abdominal:     General: Bowel sounds are normal. There is no distension.     Palpations: There is no mass.     Tenderness: There is no abdominal tenderness.     Hernia: No hernia is present.  Musculoskeletal:     Right lower leg: No edema.     Left lower leg: No edema.  Lymphadenopathy:     Cervical: No cervical adenopathy.  Neurological:     General: No focal deficit present.     Mental Status: She is alert.     Comments: Bilateral upper and lower extremity strength 5/5  Psychiatric:        Mood and Affect: Mood normal.        Behavior: Behavior normal.        Thought Content: Thought content normal.        Judgment: Judgment normal.        Assessment & Plan:   Problem List Items Addressed This Visit       Other   Overweight     Patient was a runner until injury to her right knee.  Patient states she is gained 20 pounds over the last 6 to 8 months.  Has used phentermine in the  past with good result took it for 3 months and has not needed it since.  She is interested in starting the medication back.  Pending lab results       Relevant Orders   Hemoglobin A1c   Lipid panel   Preventative health care - Primary    Discussed age-appropriate immunizations and screening exams.  Appropriate orders placed today       Relevant Orders   CBC   Comprehensive metabolic panel   Hemoglobin A1c   Lipid panel   TSH   Need for Tdap vaccination    Tdap administered in office today.       Relevant Orders   Tdap vaccine greater than or equal to 7yo IM (Completed)   Other Visit Diagnoses     Screening for colon cancer       Relevant Orders   Ambulatory referral to Gastroenterology       Return in about 1 year (around 03/04/2023) for CPE and labs.   Romilda Garret, NP

## 2022-03-03 NOTE — Assessment & Plan Note (Signed)
Discussed age-appropriate immunizations and screening exams.  Appropriate orders placed today

## 2022-03-03 NOTE — Patient Instructions (Addendum)
Nice to see you today I will be in touch with the lab results once I have them and we can discuss the phentermine Follow up with me in 1 year, sooner if you need me

## 2022-03-03 NOTE — Assessment & Plan Note (Signed)
Patient was a runner until injury to her right knee.  Patient states she is gained 20 pounds over the last 6 to 8 months.  Has used phentermine in the past with good result took it for 3 months and has not needed it since.  She is interested in starting the medication back.  Pending lab results

## 2022-03-03 NOTE — Assessment & Plan Note (Signed)
Tdap administered in office today.

## 2022-03-04 ENCOUNTER — Other Ambulatory Visit: Payer: Self-pay | Admitting: Nurse Practitioner

## 2022-03-04 DIAGNOSIS — E663 Overweight: Secondary | ICD-10-CM

## 2022-03-04 DIAGNOSIS — E875 Hyperkalemia: Secondary | ICD-10-CM

## 2022-03-04 MED ORDER — PHENTERMINE HCL 15 MG PO CAPS: 15.0000 mg | ORAL_CAPSULE | ORAL | 0 refills | Status: DC

## 2022-03-27 IMAGING — MG MM DIGITAL DIAGNOSTIC UNILAT*R* W/ TOMO W/ CAD
4 series · 4 of 12 positions shown · non-contrast
Comparison: 08/07/2020 and earlier
COMPARISON: 08/07/2020 and earlier

Addendum:
CLINICAL DATA: Short interval follow-up for asymmetry in the RIGHT
breast.

EXAM:
DIGITAL DIAGNOSTIC UNILATERAL RIGHT MAMMOGRAM WITH TOMOSYNTHESIS AND
CAD
TECHNIQUE: Right digital diagnostic mammography and breast tomosynthesis was
performed. The images were evaluated with computer-aided detection.

[R MLO synth-2D]
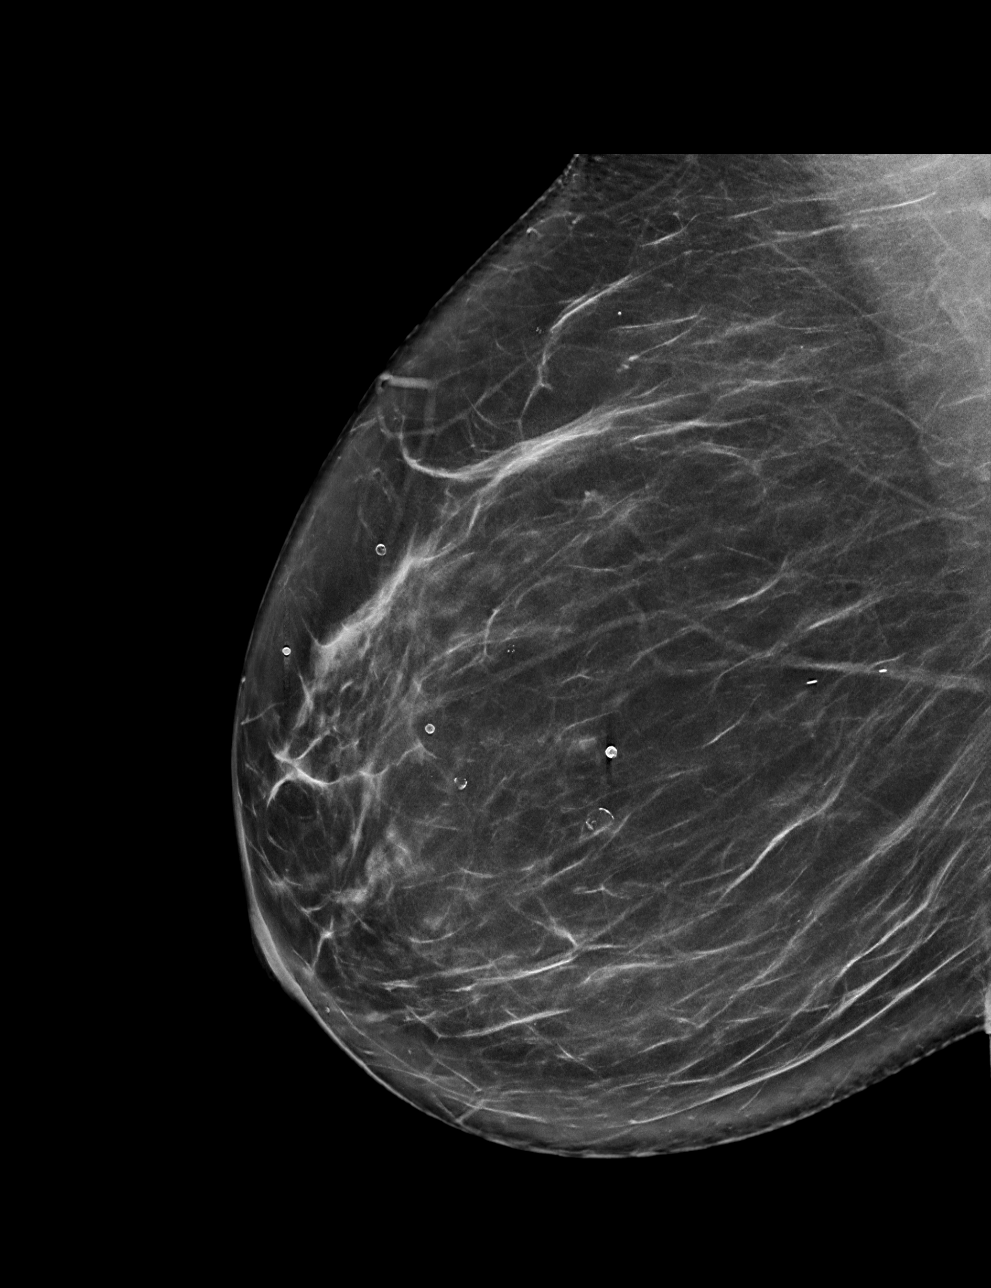

[R CC synth-2D]
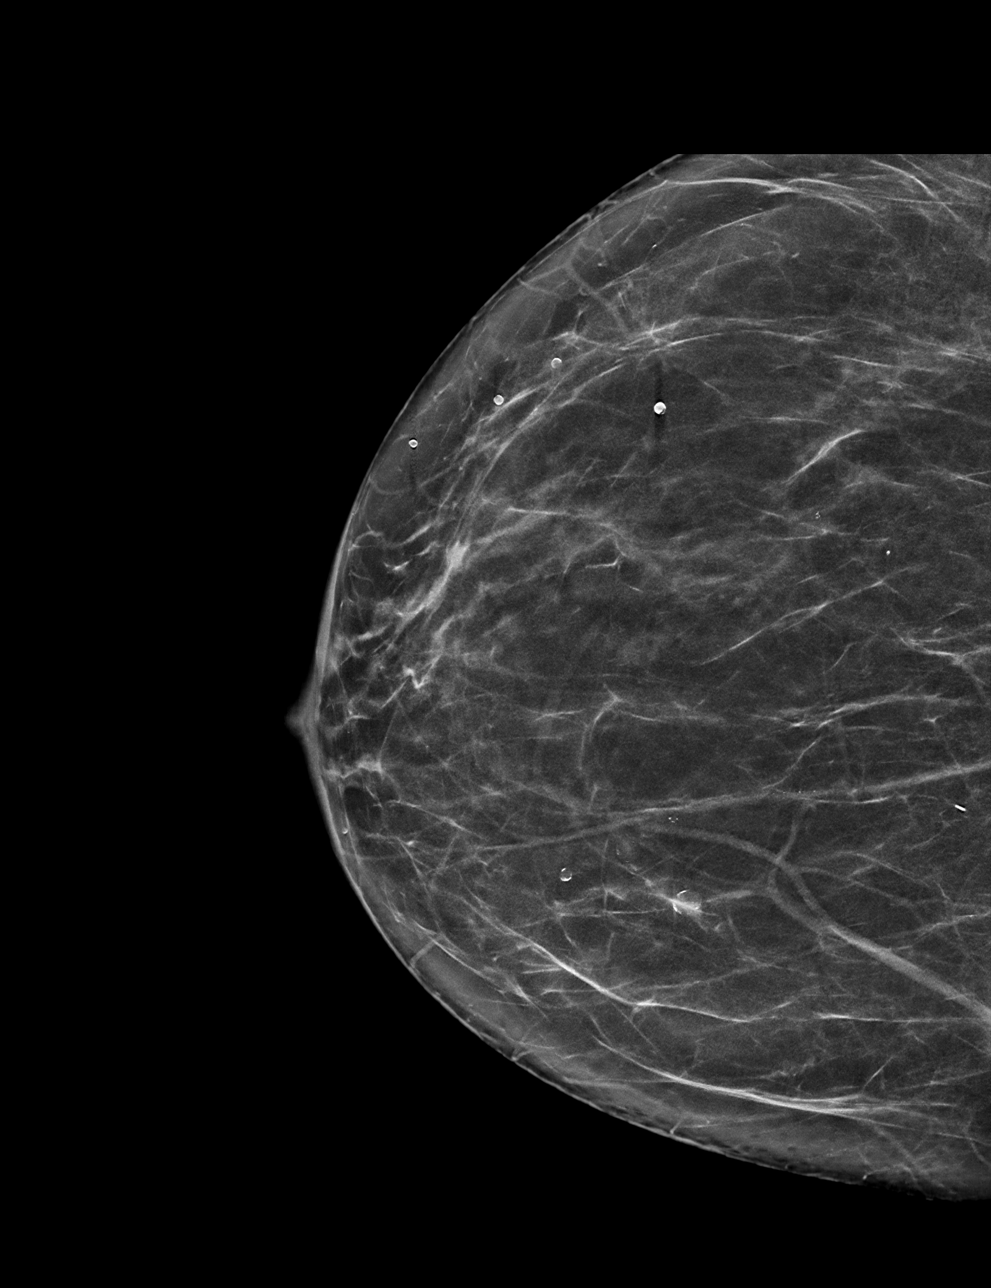

[R CC tomo · tomo slice 32/63.0]
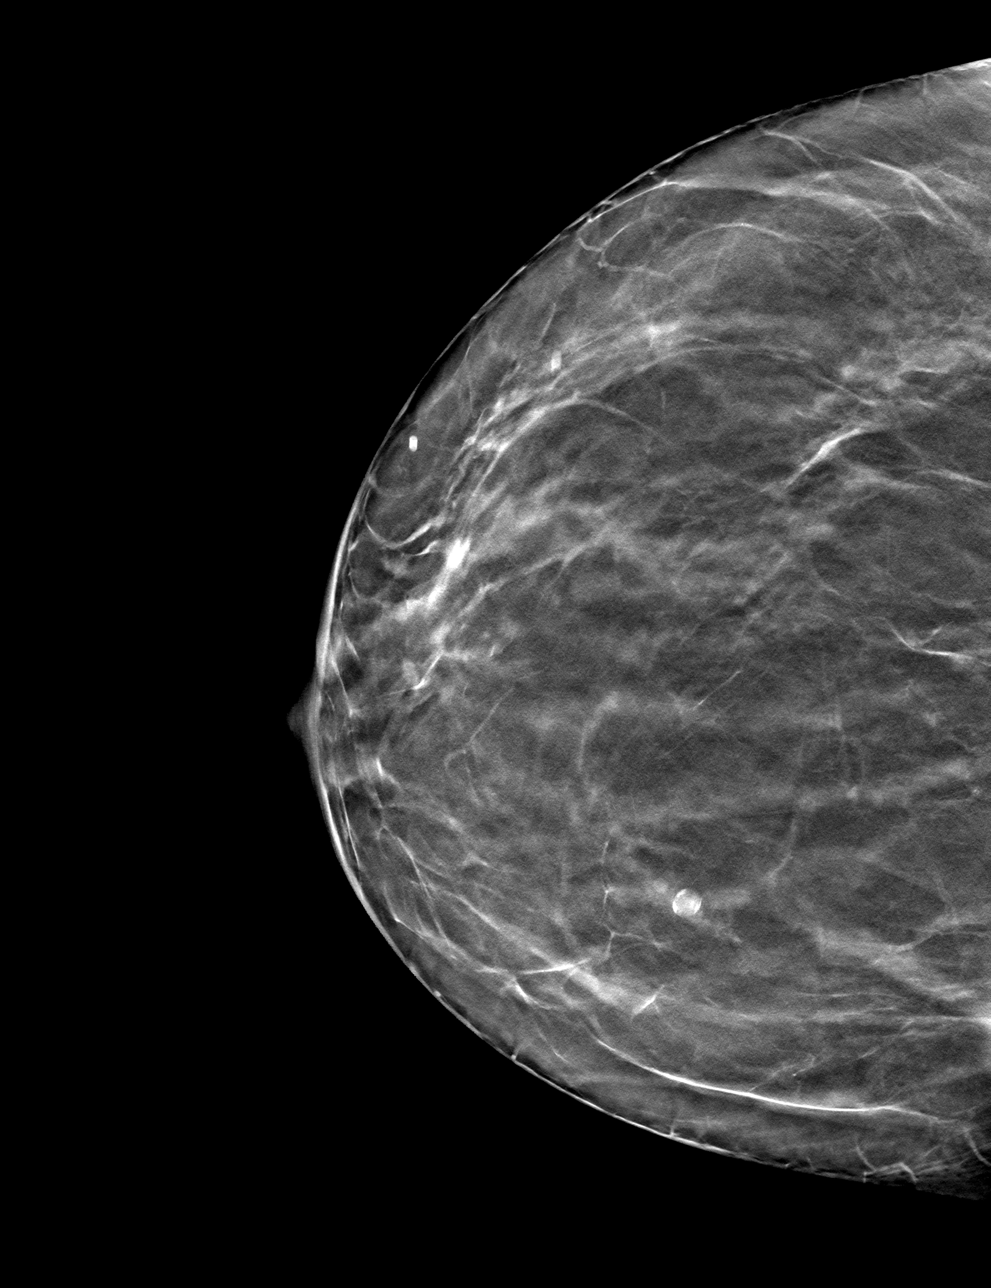

[R MLO tomo · tomo slice 39/76.0]
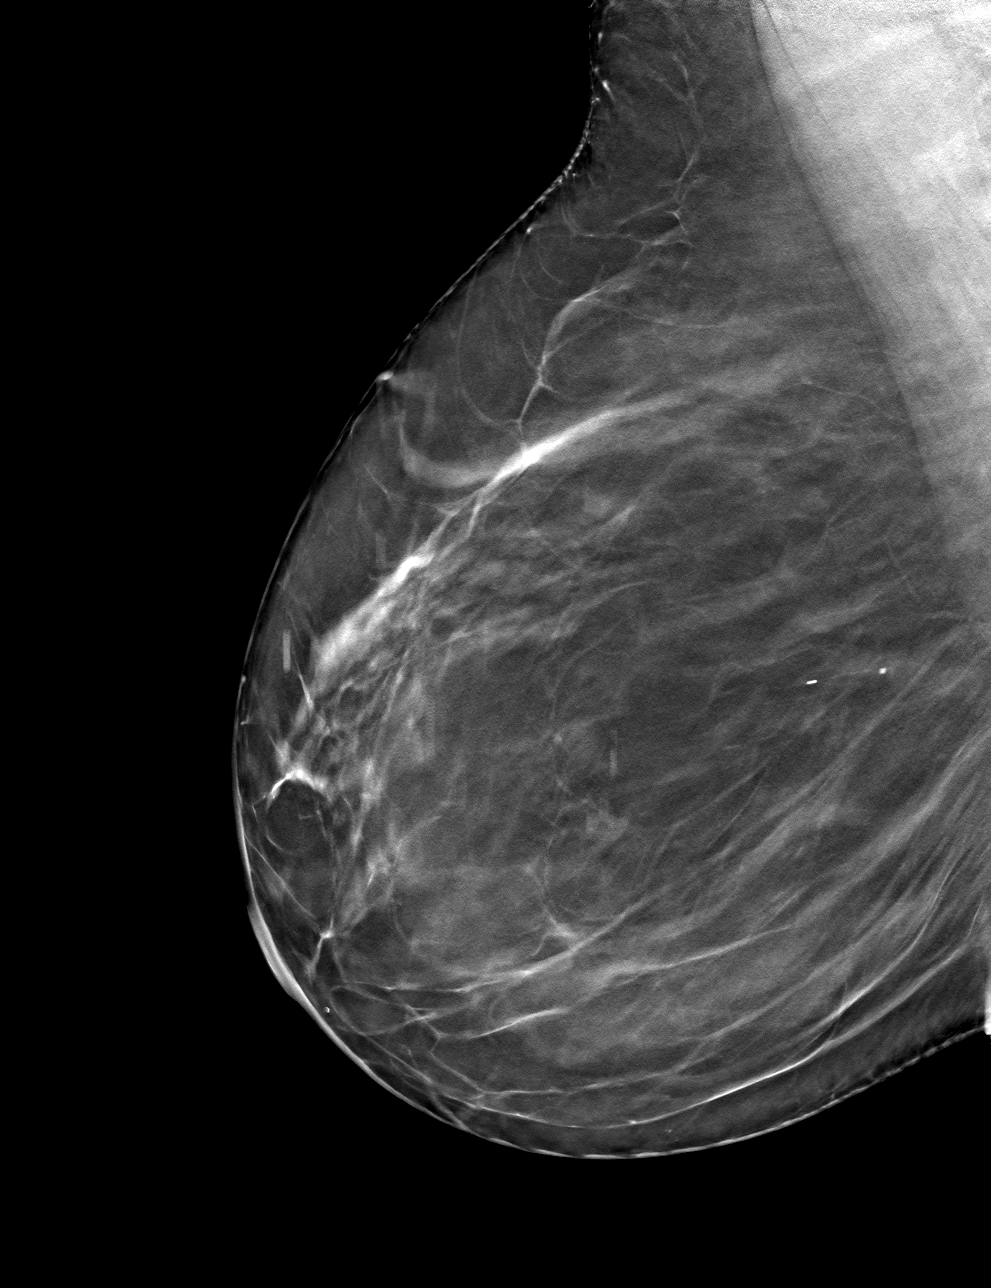

[4 of 12 positions shown; findings below may reference images not displayed]

ACR Breast Density Category b: There are scattered areas of
fibroglandular density.
FINDINGS: Within the MEDIAL aspect of the RIGHT breast, there are numerous
partially calcified oil cysts, stable in appearance compared to
prior study. There has been interval resolution of asymmetry in this
same region. No new or suspicious findings in the RIGHT breast.
IMPRESSION: Interval resolution of asymmetry in the MEDIAL portion of the RIGHT
breast, in an area of fat necrosis. No mammographic evidence for
malignancy.

RECOMMENDATION:
Recommend screening mammogram in July 2020.

I have discussed the findings and recommendations with the patient.
If applicable, a reminder letter will be sent to the patient
regarding the next appointment.

BI-RADS CATEGORY  2: Benign.

ADDENDUM:
CORRECTED REPORT:

RECOMMENDATION:

Recommend screening mammogram in July 2021.

*** End of Addendum ***
ACR Breast Density Category b: There are scattered areas of
fibroglandular density.
FINDINGS: Within the MEDIAL aspect of the RIGHT breast, there are numerous
partially calcified oil cysts, stable in appearance compared to
prior study. There has been interval resolution of asymmetry in this
same region. No new or suspicious findings in the RIGHT breast.
IMPRESSION: Interval resolution of asymmetry in the MEDIAL portion of the RIGHT
breast, in an area of fat necrosis. No mammographic evidence for
malignancy.

RECOMMENDATION:
Recommend screening mammogram in July 2020.

I have discussed the findings and recommendations with the patient.
If applicable, a reminder letter will be sent to the patient
regarding the next appointment.

BI-RADS CATEGORY  2: Benign.

## 2022-04-01 ENCOUNTER — Encounter: Payer: Self-pay | Admitting: Nurse Practitioner

## 2022-04-01 ENCOUNTER — Ambulatory Visit: Payer: BC Managed Care – PPO | Admitting: Nurse Practitioner

## 2022-04-01 VITALS — BP 124/74 | HR 75 | Temp 98.5°F | Ht 63.0 in | Wt 159.0 lb

## 2022-04-01 DIAGNOSIS — Z713 Dietary counseling and surveillance: Secondary | ICD-10-CM | POA: Diagnosis not present

## 2022-04-01 DIAGNOSIS — E875 Hyperkalemia: Secondary | ICD-10-CM

## 2022-04-01 DIAGNOSIS — E663 Overweight: Secondary | ICD-10-CM | POA: Diagnosis not present

## 2022-04-01 DIAGNOSIS — Z6828 Body mass index (BMI) 28.0-28.9, adult: Secondary | ICD-10-CM | POA: Diagnosis not present

## 2022-04-01 MED ORDER — PHENTERMINE HCL 15 MG PO CAPS
15.0000 mg | ORAL_CAPSULE | ORAL | 0 refills | Status: DC
Start: 2022-04-01 — End: 2022-05-24

## 2022-04-01 NOTE — Patient Instructions (Signed)
Nice to see you today Continue keeping up the hard work, it is paying off I will see you in a year for your next physical

## 2022-04-01 NOTE — Assessment & Plan Note (Signed)
Slight hyperkalemia.  Patient was notified after labs resulted and she did not want to come in for lab draw.  Did discuss this with patient today she still deferred lab draw.  Did tell her that the fear is that the potassium is mounting and this could be deadly resulting in cardiac arrest.  Patient acknowledged understood

## 2022-04-01 NOTE — Progress Notes (Signed)
Established Patient Office Visit  Subjective   Patient ID: Brenda Silva, female    DOB: 29-Mar-1971  Age: 51 y.o. MRN: [id number]  Chief Complaint  Patient presents with   Follow-up    HPI  Weight Recheck: Patient established care with me on 03/03/2022. She was concerned with her weight and states that she has used phentermine in the past that helped jump start her weight loss. She was prescribed phentermine '15mg'$  and presents for a follow up in office. States that she has been taking the medication most days Stats that she has been going to the gym 5 days a week. States Sunday she rode a bike for 20 miles and ran 3 miles Monday. She has cut M&Ms which she use to have that daily     Review of Systems  Constitutional:  Positive for weight loss. Negative for chills and fever.  Cardiovascular:  Negative for chest pain and palpitations.  Psychiatric/Behavioral:  The patient does not have insomnia.       Objective:     BP 124/74   Pulse 75   Temp 98.5 F (36.9 C) (Oral)   Ht '5\' 3"'$  (1.6 m)   Wt 159 lb (72.1 kg)   LMP 04/29/2017   SpO2 99%   BMI 28.17 kg/m  BP Readings from Last 3 Encounters:  04/01/22 124/74  03/03/22 126/80  07/13/21 125/82   Wt Readings from Last 3 Encounters:  04/01/22 159 lb (72.1 kg)  03/03/22 165 lb (74.8 kg)  07/13/21 161 lb (73 kg)      Physical Exam Vitals and nursing note reviewed.  Constitutional:      Appearance: Normal appearance.  Cardiovascular:     Rate and Rhythm: Normal rate and regular rhythm.     Heart sounds: Normal heart sounds.  Pulmonary:     Effort: Pulmonary effort is normal.     Breath sounds: Normal breath sounds.  Abdominal:     General: Bowel sounds are normal.  Musculoskeletal:     Right lower leg: No edema.     Left lower leg: No edema.  Neurological:     Mental Status: She is alert.      No results found for any visits on 04/01/22.    The 10-year ASCVD risk score (Arnett DK, et al., 2019) is:  1.9%    Assessment & Plan:   Problem List Items Addressed This Visit       Other   Weight loss counseling, encounter for - Primary    Patient has increased her physical activity and working on her diet reducing excess sugar.  Patient is tolerating phentermine 15 mg daily without any adverse drug event.  We will continue medication this month and 1 more and patient will need to stop      BMI 28.0-28.9,adult    Patient has been working on lifestyle modifications.  She has lost some weight since last visit.  Tolerating medication well.  We will continue for another 2 months and then stop.      Overweight   Relevant Medications   phentermine 15 MG capsule   Hyperkalemia    Slight hyperkalemia.  Patient was notified after labs resulted and she did not want to come in for lab draw.  Did discuss this with patient today she still deferred lab draw.  Did tell her that the fear is that the potassium is mounting and this could be deadly resulting in cardiac arrest.  Patient acknowledged understood  Return in about 1 year (around 04/02/2023) for CPE and Labs.    Romilda Garret, NP

## 2022-04-01 NOTE — Assessment & Plan Note (Signed)
Patient has increased her physical activity and working on her diet reducing excess sugar.  Patient is tolerating phentermine 15 mg daily without any adverse drug event.  We will continue medication this month and 1 more and patient will need to stop

## 2022-04-01 NOTE — Assessment & Plan Note (Signed)
Patient has been working on lifestyle modifications.  She has lost some weight since last visit.  Tolerating medication well.  We will continue for another 2 months and then stop.

## 2022-05-13 ENCOUNTER — Ambulatory Visit (AMBULATORY_SURGERY_CENTER): Payer: BC Managed Care – PPO

## 2022-05-13 VITALS — Ht 63.0 in | Wt 153.0 lb

## 2022-05-13 DIAGNOSIS — Z1211 Encounter for screening for malignant neoplasm of colon: Secondary | ICD-10-CM

## 2022-05-13 MED ORDER — SUTAB 1479-225-188 MG PO TABS
12.0000 | ORAL_TABLET | ORAL | 0 refills | Status: DC
Start: 2022-05-13 — End: 2022-06-13

## 2022-05-13 NOTE — Progress Notes (Signed)
No egg or soy allergy known to patient   No issues known to pt with past sedation with any surgeries or procedures  Patient denies ever being told they had issues or difficulty with intubation   No FH of Malignant Hyperthermia  Pt is not on diet pills-pt states not taking phentermine as of now, has some left, instruct pt not to take for 10 days prior to colonoscopy(stop on 06/03/22)  Pt is not on  home 02  Pt is not on blood thinners  Pt denies issues with constipation  No A fib or A flutter  Virtual previsit  Pt request sutab, coupon sent  Have any cardiac testing pending--no  Pt instructed to use Singlecare.com or GoodRx for a price reduction on prep

## 2022-05-16 ENCOUNTER — Other Ambulatory Visit: Payer: Self-pay | Admitting: Adult Health

## 2022-05-24 ENCOUNTER — Other Ambulatory Visit: Payer: Self-pay | Admitting: Nurse Practitioner

## 2022-05-24 DIAGNOSIS — E663 Overweight: Secondary | ICD-10-CM

## 2022-05-30 ENCOUNTER — Encounter: Payer: Self-pay | Admitting: Internal Medicine

## 2022-06-13 ENCOUNTER — Ambulatory Visit (AMBULATORY_SURGERY_CENTER): Payer: BC Managed Care – PPO | Admitting: Internal Medicine

## 2022-06-13 ENCOUNTER — Encounter: Payer: Self-pay | Admitting: Internal Medicine

## 2022-06-13 VITALS — BP 106/51 | HR 60 | Temp 96.4°F | Resp 12 | Ht 63.0 in | Wt 153.0 lb

## 2022-06-13 DIAGNOSIS — D123 Benign neoplasm of transverse colon: Secondary | ICD-10-CM | POA: Diagnosis not present

## 2022-06-13 DIAGNOSIS — D122 Benign neoplasm of ascending colon: Secondary | ICD-10-CM | POA: Diagnosis not present

## 2022-06-13 DIAGNOSIS — Z1211 Encounter for screening for malignant neoplasm of colon: Secondary | ICD-10-CM

## 2022-06-13 MED ORDER — HYDROCORTISONE (PERIANAL) 2.5 % EX CREA: 1.0000 | TOPICAL_CREAM | Freq: Two times a day (BID) | CUTANEOUS | 1 refills | Status: DC

## 2022-06-13 MED ORDER — SODIUM CHLORIDE 0.9 % IV SOLN: 500.0000 mL | Freq: Once | INTRAVENOUS | Status: AC

## 2022-06-13 NOTE — Progress Notes (Signed)
Pt in recovery with monitors in place, VSS. Report given to receiving RN.  °

## 2022-06-13 NOTE — Patient Instructions (Addendum)
YOU HAD AN ENDOSCOPIC PROCEDURE TODAY AT [facility]:   Refer to the procedure report that was given to you for any specific questions about what was found during the examination.  If the procedure report does not answer your questions, please call your gastroenterologist to clarify.  If you requested that your care partner not be given the details of your procedure findings, then the procedure report has been included in a sealed envelope for you to review at your convenience later.  YOU SHOULD EXPECT: Some feelings of bloating in the abdomen. Passage of more gas than usual.  Walking can help get rid of the air that was put into your GI tract during the procedure and reduce the bloating. If you had a lower endoscopy (such as a colonoscopy or flexible sigmoidoscopy) you may notice spotting of blood in your stool or on the toilet paper. If you underwent a bowel prep for your procedure, you may not have a normal bowel movement for a few days.  Please Note:  You might notice some irritation and congestion in your nose or some drainage.  This is from the oxygen used during your procedure.  There is no need for concern and it should clear up in a day or so.  SYMPTOMS TO REPORT IMMEDIATELY:  Following lower endoscopy (colonoscopy or flexible sigmoidoscopy):  Excessive amounts of blood in the stool  Significant tenderness or worsening of abdominal pains  Swelling of the abdomen that is new, acute  Fever of 100F or higher   For urgent or emergent issues, a gastroenterologist can be reached at any hour by calling [phone]. Do not use MyChart messaging for urgent concerns.    DIET:  We do recommend a small meal at first, but then you may proceed to your regular diet.  Drink plenty of fluids but you should avoid alcoholic beverages for 24 hours.  MEDICATIONS: Continue present medications. Apply Anusol HC cream to rectum twice daily for 7 days.  Please see handouts given yo you by  your recovery nurse.  Thank you for allowing [place] to provide for your healthcare needs today.  ACTIVITY:  You should plan to take it easy for the rest of today and you should NOT DRIVE or use heavy machinery until tomorrow (because of the sedation medicines used during the test).    FOLLOW UP: Our staff will call the number listed on your records the next business day following your procedure.  We will call around 7:15- 8:00 am to check on you and address any questions or concerns that you may have regarding the information given to you following your procedure. If we do not reach you, we will leave a message.     If any biopsies were taken you will be contacted by phone or by letter within the next 1-3 weeks.  Please call us at [phone] if you have not heard about the biopsies in 3 weeks.    SIGNATURES/CONFIDENTIALITY: You and/or your care partner have signed paperwork which will be entered into your electronic medical record.  These signatures attest to the fact that that the information above on your After Visit Summary has been reviewed and is understood.  Full responsibility of the confidentiality of this discharge information lies with you and/or your care-partner.

## 2022-06-13 NOTE — Progress Notes (Signed)
GASTROENTEROLOGY PROCEDURE H&P NOTE   Primary Care Physician: Michela Pitcher, NP    Reason for Procedure:   Colon cancer screening  Plan:    Colonoscopy  Patient is appropriate for endoscopic procedure(s) in the ambulatory ([facility]) setting.  The nature of the procedure, as well as the risks, benefits, and alternatives were carefully and thoroughly reviewed with the patient. Ample time for discussion and questions allowed. The patient understood, was satisfied, and agreed to proceed.     HPI: Brenda Silva is a 51 y.o. female who presents for colonoscopy for colon cancer screening. Denies blood in stools, changes in bowel habits, weight loss. Denies family history of colon cancer.  Past Medical History:  Diagnosis Date   Abnormal Pap smear    Eczema    GERD (gastroesophageal reflux disease)    History of shingles    HPV (human papilloma virus) infection    Hx of gonorrhea    PMS (premenstrual syndrome)    Seborrheic dermatitis 01/29/2013   Vaginal Pap smear, abnormal     Past Surgical History:  Procedure Laterality Date   DILATION AND CURETTAGE OF UTERUS     TUBAL LIGATION     WISDOM TOOTH EXTRACTION      Prior to Admission medications   Medication Sig Start Date End Date Taking? Authorizing Provider  Melatonin 10 MG TABS Take 10 mg by mouth as needed.    [provider]  phentermine 15 MG capsule TAKE ONE CAPSULE BY MOUTH EVERY MORNING 05/24/22   Michela Pitcher, NP  scopolamine (TRANSDERM-SCOP) 1 MG/3DAYS PLACE ONE PATCH ONTO THE SKIN EVERY 3 DAYS; PLACE DAY BEFORE THE CRUISE 05/16/22   Estill Dooms, NP    Current Outpatient Medications  Medication Sig Dispense Refill   Melatonin 10 MG TABS Take 10 mg by mouth as needed.     phentermine 15 MG capsule TAKE ONE CAPSULE BY MOUTH EVERY MORNING 30 capsule 0   scopolamine (TRANSDERM-SCOP) 1 MG/3DAYS PLACE ONE PATCH ONTO THE SKIN EVERY 3 DAYS; PLACE DAY BEFORE THE CRUISE 1 patch 0   Current  Facility-Administered Medications  Medication Dose Route Frequency Provider Last Rate Last Admin   0.9 %  sodium chloride infusion  500 mL Intravenous Once Sharyn Creamer, MD        Allergies as of 06/13/2022   (No Known Allergies)    Family History  Problem Relation Age of Onset   Cancer Mother 48       breast and thyroid   Breast cancer Mother 21   Colon polyps Brother    Colon cancer Neg Hx    Esophageal cancer Neg Hx    Rectal cancer Neg Hx    Stomach cancer Neg Hx     Social History   Socioeconomic History   Marital status: Married    Spouse name: Not on file   Number of children: 4   Years of education: Not on file   Highest education level: Not on file  Occupational History   Not on file  Tobacco Use   Smoking status: Never   Smokeless tobacco: Never  Vaping Use   Vaping Use: Never used  Substance and Sexual Activity   Alcohol use: Never   Drug use: Never   Sexual activity: Yes    Birth control/protection: Surgical, Post-menopausal    Comment: tubal  Other Topics Concern   Not on file  Social History Narrative   Fulltime: Materials engineer  Hobbies: riding bicycles, gym   Social Determinants of Health   Financial Resource Strain: Low Risk  (07/13/2021)   Overall Financial Resource Strain (CARDIA)    Difficulty of Paying Living Expenses: Not hard at all  Food Insecurity: No Food Insecurity (07/13/2021)   Hunger Vital Sign    Worried About Running Out of Food in the Last Year: Never true    Ran Out of Food in the Last Year: Never true  Transportation Needs: No Transportation Needs (07/13/2021)   PRAPARE - Hydrologist (Medical): No    Lack of Transportation (Non-Medical): No  Physical Activity: Sufficiently Active (07/13/2021)   Exercise Vital Sign    Days of Exercise per Week: 4 days    Minutes of Exercise per Session: 40 min  Stress: Stress Concern Present (07/13/2021)   [employer]    Feeling of Stress : To some extent  Social Connections: Moderately Integrated (07/13/2021)   Social Connection and Isolation Panel [NHANES]    Frequency of Communication with Friends and Family: More than three times a week    Frequency of Social Gatherings with Friends and Family: Once a week    Attends Religious Services: More than 4 times per year    Active Member of [employer] or Organizations: No    Attends Archivist Meetings: Never    Marital Status: Married  Human resources officer Violence: Not At Risk (07/13/2021)   Humiliation, Afraid, Rape, and Kick questionnaire    Fear of Current or Ex-Partner: No    Emotionally Abused: No    Physically Abused: No    Sexually Abused: No    Physical Exam: Vital signs in last 24 hours: BP 112/62   Pulse (!) 53   Temp (!) 96.4 F (35.8 C)   Resp 14   Ht '5\' 3"'$  (1.6 m)   Wt 153 lb (69.4 kg)   LMP 04/29/2017   SpO2 100%   BMI 27.10 kg/m  GEN: NAD EYE: Sclerae anicteric ENT: MMM CV: Non-tachycardic Pulm: No increased work of breathing GI: Soft, NT/ND NEURO:  Alert & Oriented   Christia Reading, MD [facility]  06/13/2022 8:15 AM

## 2022-06-13 NOTE — Op Note (Addendum)
[facility] Patient Name: Brenda Silva Procedure Date: 06/13/2022 7:35 AM MRN: [id number] Endoscopist: Brenda Silva "Brenda Silva ,  Age: 51 Referring MD:  Date of Birth: 1971/08/09 Gender: Female Account #: [id number] Procedure:                Colonoscopy Indications:              Screening for colorectal malignant neoplasm, This                            is the patient's first colonoscopy Medicines:                Monitored Anesthesia Care Procedure:                Pre-Anesthesia Assessment:                           - Prior to the procedure, a History and Physical                            was performed, and patient medications and                            allergies were reviewed. The patient's tolerance of                            previous anesthesia was also reviewed. The risks                            and benefits of the procedure and the sedation                            options and risks were discussed with the patient.                            All questions were answered, and informed consent                            was obtained. Prior Anticoagulants: The patient has                            taken no previous anticoagulant or antiplatelet                            agents. ASA Grade Assessment: II - A patient with                            mild systemic disease. After reviewing the risks                            and benefits, the patient was deemed in                            satisfactory condition to undergo the procedure.  After obtaining informed consent, the colonoscope                            was passed under direct vision. Throughout the                            procedure, the patient's blood pressure, pulse, and                            oxygen saturations were monitored continuously. The                            CF [id number] #[id number] was introduced through the anus                            and advanced to the  the cecum, identified by                            appendiceal orifice and ileocecal valve. The                            colonoscopy was performed without difficulty. The                            patient tolerated the procedure well. The quality                            of the bowel preparation was excellent. The                            ileocecal valve, appendiceal orifice, and rectum                            were photographed. Scope In: 8:16:52 AM Scope Out: 8:33:14 AM Scope Withdrawal Time: 0 hours 13 minutes 9 seconds  Total Procedure Duration: 0 hours 16 minutes 22 seconds  Findings:                 Two sessile polyps were found in the transverse                            colon and ascending colon. The polyps were 3 to 4                            mm in size. These polyps were removed with a cold                            snare. Resection and retrieval were complete.                           Non-bleeding internal hemorrhoids were found during                            retroflexion. Complications:  No immediate complications. Estimated Blood Loss:     Estimated blood loss was minimal. Impression:               - Two 3 to 4 mm polyps in the transverse colon and                            in the ascending colon, removed with a cold snare.                            Resected and retrieved.                           - Non-bleeding internal hemorrhoids. Recommendation:           - Discharge patient to home (with escort).                           - Await pathology results.                           - Anusol HC cream BID for 7 days.                           - The findings and recommendations were discussed                            with the patient. Dr Brenda Silva "Brenda Silva" Brenda Silva,  06/13/2022 8:38:52 AM

## 2022-06-13 NOTE — Progress Notes (Signed)
Called to room to assist during endoscopic procedure.  Patient ID and intended procedure confirmed with present staff. Received instructions for my participation in the procedure from the performing physician.  

## 2022-06-13 NOTE — Progress Notes (Signed)
Pt's states no medical or surgical changes since previsit or office visit. 

## 2022-06-14 ENCOUNTER — Telehealth: Payer: Self-pay

## 2022-06-14 NOTE — Telephone Encounter (Signed)
  Follow up Call-     06/13/2022    7:28 AM  Call back number  Post procedure Call Back phone  # [phone]  Permission to leave phone message Yes     Patient questions:  Do you have a fever, pain , or abdominal swelling? No. Pain Score  0 *  Have you tolerated food without any problems? Yes.    Have you been able to return to your normal activities? Yes.    Do you have any questions about your discharge instructions: Diet   No. Medications  No. Follow up visit  No.  Do you have questions or concerns about your Care? No.  Actions: * If pain score is 4 or above: No action needed, pain <4.

## 2022-06-16 ENCOUNTER — Encounter: Payer: Self-pay | Admitting: Internal Medicine

## 2022-07-03 HISTORY — PX: COLONOSCOPY: SHX174

## 2022-08-02 ENCOUNTER — Encounter: Payer: Self-pay | Admitting: Adult Health

## 2022-08-02 ENCOUNTER — Ambulatory Visit (INDEPENDENT_AMBULATORY_CARE_PROVIDER_SITE_OTHER): Payer: BC Managed Care – PPO | Admitting: Adult Health

## 2022-08-02 VITALS — BP 108/62 | HR 59 | Ht 63.0 in | Wt 150.5 lb

## 2022-08-02 DIAGNOSIS — Z780 Asymptomatic menopausal state: Secondary | ICD-10-CM | POA: Diagnosis not present

## 2022-08-02 DIAGNOSIS — R232 Flushing: Secondary | ICD-10-CM

## 2022-08-02 DIAGNOSIS — E875 Hyperkalemia: Secondary | ICD-10-CM | POA: Diagnosis not present

## 2022-08-02 DIAGNOSIS — Z124 Encounter for screening for malignant neoplasm of cervix: Secondary | ICD-10-CM

## 2022-08-02 DIAGNOSIS — Z01419 Encounter for gynecological examination (general) (routine) without abnormal findings: Secondary | ICD-10-CM

## 2022-08-02 MED ORDER — DUAVEE 0.45-20 MG PO TABS: 1.0000 | ORAL_TABLET | Freq: Every day | ORAL | 0 refills | Status: DC

## 2022-08-02 NOTE — Progress Notes (Signed)
Patient ID: [name], female   DOB: 1970/11/23, 51 y.o.   MRN: [id number] History of Present Illness: Doll is 51 year old white female,married, PM in for well woman gyn exam. She complains of hot flashes,night sweats and not sleeping good, and she is moody, gets irritated easily. She hasPCP now and had labs in June and potassium slightly elevated, and physical then too.   Last pap was negative HPV and malignancy 07/13/21.  PCP is Dr Charmian Muff.  Current Medications, Allergies, Past Medical History, Past Surgical History, Family History and Social History were reviewed in [employer] record.     Review of Systems: Patient denies any headaches, hearing loss, fatigue, blurred vision, shortness of breath, chest pain, abdominal pain, problems with bowel movements, urination, or intercourse. No joint pain. She denies nay vaginal bleeding See HPI for positives.     Physical Exam:BP 108/62 (BP Location: Left Arm, Patient Position: Sitting, Cuff Size: Normal)   Pulse (!) 59   Ht '5\' 3"'$  (1.6 m)   Wt 150 lb 8 oz (68.3 kg)   LMP 04/29/2017   BMI 26.66 kg/m   General:  Well developed, well nourished, no acute distress Skin:  Warm and dry Neck:  Midline trachea, normal thyroid, good ROM, no lymphadenopathy Lungs; Clear to auscultation bilaterally Breast:  No dominant palpable mass, retraction, or nipple discharge Cardiovascular: Regular rate and rhythm Abdomen:  Soft, non tender, no hepatosplenomegaly Pelvic:  External genitalia is normal in appearance, no lesions.  The vagina is normal in appearance. Urethra has no lesions or masses. The cervix is smooth.  Uterus is felt to be normal size, shape, and contour.  No adnexal masses or tenderness noted.Bladder is non tender, no masses felt. Rectal: Deferred  Extremities/musculoskeletal:  No swelling or varicosities noted, no clubbing or cyanosis Psych:  No mood changes, alert and cooperative,seems happy Fall risk is low     08/02/2022    8:41 AM 03/03/2022   11:47 AM 07/13/2021    9:29 AM  Depression screen PHQ 2/9  Decreased Interest '2 2 2  '$ Down, Depressed, Hopeless 0 0 1  PHQ - 2 Score '2 2 3  '$ Altered sleeping '3 2 3  '$ Tired, decreased energy '1 1 3  '$ Change in appetite '2 3 1  '$ Feeling bad or failure about yourself  0 0 0  Trouble concentrating 2 0 0  Moving slowly or fidgety/restless 0 0 0  Suicidal thoughts 0 0 0  PHQ-9 Score '10 8 10  '$ Difficult doing work/chores  Not difficult at all        08/02/2022    8:43 AM 07/13/2021    9:29 AM 05/21/2020    9:47 AM  GAD 7 : Generalized Anxiety Score  Nervous, Anxious, on Edge 0 0 0  Control/stop worrying 1 0 0  Worry too much - different things 1 0 0  Trouble relaxing 3 1 0  Restless '2 1 1  '$ Easily annoyed or irritable '3 3 3  '$ Afraid - awful might happen 0 0 0  Total GAD 7 Score '10 5 4  '$ Anxiety Difficulty   Not difficult at all      [facility] - 08/02/22 0839       Pregnancy Intention Screening   Does the patient want to become pregnant in the next year? No    Does the patient's partner want to become pregnant in the next year? No    Would the patient like to discuss contraceptive options today? No  Contraception Wrap Up   Current Method Female Sterilization    End Method Female Sterilization    Contraception Counseling Provided No             Examination chaperoned by Levy Pupa LPN  Impression and Plan: 1. Encounter for well woman exam with routine gynecological exam GYN exam in 1 year Pap in 2025 Mammogram was negative 11/11/21 Colonoscopy was 06/13/22 Labs with PCP  2. PM Denies any bleeding  3. Hot flashes Will try HRT, has tried in past and stopped Will try [facility], #28 tablets given, take 1 daily  Follow up with me in 27 days for ROS   4. Serum potassium elevated Check CMP

## 2022-08-03 ENCOUNTER — Telehealth: Payer: Self-pay | Admitting: Adult Health

## 2022-08-03 LAB — COMPREHENSIVE METABOLIC PANEL
ALT: 30 IU/L (ref 0–32)
AST: 29 IU/L (ref 0–40)
Albumin/Globulin Ratio: 1.7 (ref 1.2–2.2)
Albumin: 4.5 g/dL (ref 3.8–4.9)
Alkaline Phosphatase: 122 IU/L — ABNORMAL HIGH (ref 44–121)
BUN/Creatinine Ratio: 12 (ref 9–23)
BUN: 12 mg/dL (ref 6–24)
Bilirubin Total: 0.3 mg/dL (ref 0.0–1.2)
CO2: 24 mmol/L (ref 20–29)
Calcium: 9.9 mg/dL (ref 8.7–10.2)
Chloride: 103 mmol/L (ref 96–106)
Creatinine, Ser: 0.97 mg/dL (ref 0.57–1.00)
Globulin, Total: 2.6 g/dL (ref 1.5–4.5)
Glucose: 85 mg/dL (ref 70–99)
Potassium: 4.9 mmol/L (ref 3.5–5.2)
Sodium: 141 mmol/L (ref 134–144)
Total Protein: 7.1 g/dL (ref 6.0–8.5)
eGFR: 70 mL/min/{1.73_m2} (ref >59)

## 2022-08-03 NOTE — Telephone Encounter (Signed)
Pt aware potassium normal

## 2022-08-29 ENCOUNTER — Telehealth: Payer: Self-pay | Admitting: Adult Health

## 2022-08-29 ENCOUNTER — Ambulatory Visit: Payer: BC Managed Care – PPO | Admitting: Adult Health

## 2022-08-29 MED ORDER — DUAVEE 0.45-20 MG PO TABS: 1.0000 | ORAL_TABLET | Freq: Every day | ORAL | 12 refills | Status: AC

## 2022-08-29 NOTE — Telephone Encounter (Signed)
Pt would like a call from [facility]

## 2022-08-29 NOTE — Telephone Encounter (Signed)
Patient needs to speak with you about a medication you give her. Please advise.

## 2022-08-29 NOTE — Telephone Encounter (Signed)
Had to reschedule appt, will refill Duavee doing good.

## 2022-08-30 ENCOUNTER — Telehealth: Payer: Self-pay

## 2022-08-30 NOTE — Telephone Encounter (Signed)
Left message letting pt know I started her PA today on Duavee. It can take 24-72 hours before I know anything. Advised lets see what the insurance says and we will go from there. [place]

## 2022-08-30 NOTE — Telephone Encounter (Signed)
Patient would like to speak with Anderson Malta about getting a coupon for a prescription.

## 2022-08-31 ENCOUNTER — Telehealth: Payer: Self-pay | Admitting: *Deleted

## 2022-08-31 NOTE — Telephone Encounter (Signed)
Pt's insurance has approved Duavee effective 08/30/22-08/29/23. Pt and pharmacy aware. Copay will be $50.00. Pt to come by office and pick up coupon. Coupon left at front desk. [place]

## 2022-09-12 ENCOUNTER — Ambulatory Visit: Payer: BC Managed Care – PPO | Admitting: Adult Health

## 2022-10-18 ENCOUNTER — Other Ambulatory Visit: Payer: Self-pay | Admitting: Adult Health

## 2022-10-18 DIAGNOSIS — Z1231 Encounter for screening mammogram for malignant neoplasm of breast: Secondary | ICD-10-CM

## 2022-11-04 ENCOUNTER — Ambulatory Visit: Payer: BC Managed Care – PPO | Admitting: Nurse Practitioner

## 2022-11-04 ENCOUNTER — Encounter: Payer: Self-pay | Admitting: Nurse Practitioner

## 2022-11-04 VITALS — BP 118/58 | HR 85 | Ht 63.0 in | Wt 151.0 lb

## 2022-11-04 DIAGNOSIS — R1013 Epigastric pain: Secondary | ICD-10-CM | POA: Diagnosis not present

## 2022-11-04 LAB — CBC
HCT: 40.2 % (ref 36.0–46.0)
Hemoglobin: 13.6 g/dL (ref 12.0–15.0)
MCHC: 33.8 g/dL (ref 30.0–36.0)
MCV: 86.7 fl (ref 78.0–100.0)
Platelets: 349 K/uL (ref 150.0–400.0)
RBC: 4.64 Mil/uL (ref 3.87–5.11)
RDW: 12.7 % (ref 11.5–15.5)
WBC: 7.4 K/uL (ref 4.0–10.5)

## 2022-11-04 LAB — COMPREHENSIVE METABOLIC PANEL WITH GFR
ALT: 25 U/L (ref 0–35)
AST: 21 U/L (ref 0–37)
Albumin: 4.2 g/dL (ref 3.5–5.2)
Alkaline Phosphatase: 98 U/L (ref 39–117)
BUN: 11 mg/dL (ref 6–23)
CO2: 29 meq/L (ref 19–32)
Calcium: 9.5 mg/dL (ref 8.4–10.5)
Chloride: 103 meq/L (ref 96–112)
Creatinine, Ser: 0.86 mg/dL (ref 0.40–1.20)
GFR: 76.79 mL/min
Glucose, Bld: 78 mg/dL (ref 70–99)
Potassium: 4.5 meq/L (ref 3.5–5.1)
Sodium: 140 meq/L (ref 135–145)
Total Bilirubin: 0.5 mg/dL (ref 0.2–1.2)
Total Protein: 7.5 g/dL (ref 6.0–8.3)

## 2022-11-04 MED ORDER — OMEPRAZOLE 20 MG PO CPDR: 20.0000 mg | DELAYED_RELEASE_CAPSULE | Freq: Every day | ORAL | 3 refills | Status: AC

## 2022-11-04 NOTE — Assessment & Plan Note (Signed)
Will treat patient with omeprazole 20 mg once daily.  Did offer to do Carafate 4 times daily for 10 days but patient declined this since a lot of medicine per day.  Check basic labs.  This resolves great if not we will consider doing a gallbladder dysfunction workup inclusive of ultrasound and possible HIDA scan if needed.

## 2022-11-04 NOTE — Patient Instructions (Signed)
Nice to see you today We will try tomeprazole for one month. If it does not help let me know Follow up in 4 months for you physical, sooner if you need me

## 2022-11-04 NOTE — Progress Notes (Signed)
Established Patient Office Visit  Subjective   Patient ID: Brenda Silva, female    DOB: 10/27/1970  Age: 52 y.o. MRN: [id number]  Chief Complaint  Patient presents with   Abdominal Pain    Upper abd/stomach, comes and goes, does have hx GERD, does feel bloated, denies gas/burping/n/v/d       Abdominal pain With a history of GERD, Eczema, elevated ALT. Galbladder and appendix is still intact  Abdominal Pain: States that it has happened twice over the past couple weeks. State that she had it in the past and states that she was heavy then States that it just hurts at the bottom of the breast bone. States that it is constant when it starts and then will go away. States 30 mins - hours. States that she has to pace with the pain.  No nausea or vomiting. States that she is unsure if food exacerbates it.  States that she has a BM once every 3 days. States that  Last colonoscopy was done on 06/13/2022. Show internal non bleeding hemorrhoids and 2 polyps with a 7 year recall   Review of Systems  Constitutional:  Negative for chills and fever.  Respiratory:  Negative for shortness of breath.   Cardiovascular:  Negative for chest pain.  Gastrointestinal:  Positive for abdominal pain. Negative for constipation, diarrhea, nausea and vomiting.  Genitourinary:  Negative for dysuria and hematuria.      Objective:     BP (!) 118/58   Pulse 85   Ht '5\' 3"'$  (1.6 m)   Wt 151 lb (68.5 kg)   LMP 04/29/2017   SpO2 97%   BMI 26.75 kg/m  BP Readings from Last 3 Encounters:  11/04/22 (!) 118/58  08/02/22 108/62  06/13/22 (!) 106/51   Wt Readings from Last 3 Encounters:  11/04/22 151 lb (68.5 kg)  08/02/22 150 lb 8 oz (68.3 kg)  06/13/22 153 lb (69.4 kg)      Physical Exam Vitals and nursing note reviewed.  Constitutional:      Appearance: Normal appearance.  Cardiovascular:     Rate and Rhythm: Normal rate and regular rhythm.     Heart sounds: Normal heart sounds.  Pulmonary:      Effort: Pulmonary effort is normal.     Breath sounds: Normal breath sounds.  Abdominal:     General: Bowel sounds are normal. There is no distension.     Palpations: There is no mass.     Tenderness: There is abdominal tenderness.     Hernia: No hernia is present.  Neurological:     Mental Status: She is alert.      No results found for any visits on 11/04/22.    The 10-year ASCVD risk score (Arnett DK, et al., 2019) is: 1.8%    Assessment & Plan:   Problem List Items Addressed This Visit       Other   Epigastric pain - Primary    Will treat patient with omeprazole 20 mg once daily.  Did offer to do Carafate 4 times daily for 10 days but patient declined this since a lot of medicine per day.  Check basic labs.  This resolves great if not we will consider doing a gallbladder dysfunction workup inclusive of ultrasound and possible HIDA scan if needed.      Relevant Medications   omeprazole (PRILOSEC) 20 MG capsule   Other Relevant Orders   CBC   Comprehensive metabolic panel    Return in  about 4 months (around 03/05/2023) for CPE and Labs.    Romilda Garret, NP

## 2022-11-17 DIAGNOSIS — L821 Other seborrheic keratosis: Secondary | ICD-10-CM | POA: Diagnosis not present

## 2022-11-17 DIAGNOSIS — L820 Inflamed seborrheic keratosis: Secondary | ICD-10-CM | POA: Diagnosis not present

## 2022-11-17 DIAGNOSIS — L720 Epidermal cyst: Secondary | ICD-10-CM | POA: Diagnosis not present

## 2022-12-06 ENCOUNTER — Ambulatory Visit
Admission: RE | Admit: 2022-12-06 | Discharge: 2022-12-06 | Disposition: A | Discharge status: Home / Self Care | Payer: BC Managed Care – PPO | Source: Ambulatory Visit | Attending: Adult Health | Admitting: Adult Health

## 2022-12-06 DIAGNOSIS — Z1231 Encounter for screening mammogram for malignant neoplasm of breast: Secondary | ICD-10-CM

## 2022-12-08 ENCOUNTER — Telehealth: Payer: Self-pay | Admitting: *Deleted

## 2022-12-08 NOTE — Telephone Encounter (Signed)
Pt aware mammogram was negative. Pt voiced understanding. [place]

## 2022-12-08 NOTE — Telephone Encounter (Signed)
-----   Message from Estill Dooms, NP sent at 12/07/2022  5:12 PM EST ----- Let her know mammogram negative THX

## 2022-12-22 ENCOUNTER — Telehealth: Payer: Self-pay | Admitting: Adult Health

## 2022-12-22 MED ORDER — HYDROXYZINE HCL 10 MG PO TABS: 10.0000 mg | ORAL_TABLET | Freq: Three times a day (TID) | ORAL | 2 refills | Status: AC | PRN

## 2022-12-22 NOTE — Telephone Encounter (Signed)
Pt is requesting a call back about her medication  

## 2022-12-22 NOTE — Telephone Encounter (Signed)
Pt having bad week, son called and they were expecting and baby did not have a heart beat, and her dog needs surgery.  Needs meds for anxiety will rx vistaril 10 mg

## 2023-10-06 ENCOUNTER — Other Ambulatory Visit: Payer: Self-pay | Admitting: Adult Health

## 2023-10-06 DIAGNOSIS — Z1231 Encounter for screening mammogram for malignant neoplasm of breast: Secondary | ICD-10-CM

## 2023-12-08 ENCOUNTER — Ambulatory Visit: Payer: BC Managed Care – PPO

## 2024-03-18 ENCOUNTER — Ambulatory Visit
Admission: RE | Admit: 2024-03-18 | Discharge: 2024-03-18 | Disposition: A | Discharge status: Home / Self Care | Payer: PRIVATE HEALTH INSURANCE | Source: Ambulatory Visit | Attending: Adult Health | Admitting: Adult Health

## 2024-03-18 DIAGNOSIS — Z1231 Encounter for screening mammogram for malignant neoplasm of breast: Secondary | ICD-10-CM

## 2024-03-21 ENCOUNTER — Ambulatory Visit: Payer: Self-pay | Admitting: Adult Health
